# Patient Record
Sex: Female | Born: 1990 | Hispanic: No | Marital: Married | Smoking: Never smoker
Health system: Southern US, Community
[De-identification: ages and names within clinical notes are randomized; demographics above are authoritative.]

## PROBLEM LIST (undated history)

## (undated) ENCOUNTER — Ambulatory Visit (HOSPITAL_COMMUNITY): Discharge status: Against Medical Advice | Payer: BLUE CROSS/BLUE SHIELD

## (undated) DIAGNOSIS — F419 Anxiety disorder, unspecified: Secondary | ICD-10-CM

---

## 2013-05-28 ENCOUNTER — Inpatient Hospital Stay (HOSPITAL_COMMUNITY): Admission: AD | Admit: 2013-05-28 | Payer: Self-pay | Source: Ambulatory Visit | Admitting: Obstetrics and Gynecology

## 2014-12-02 DIAGNOSIS — Z803 Family history of malignant neoplasm of breast: Secondary | ICD-10-CM | POA: Insufficient documentation

## 2014-12-02 DIAGNOSIS — Z8249 Family history of ischemic heart disease and other diseases of the circulatory system: Secondary | ICD-10-CM | POA: Insufficient documentation

## 2015-11-04 DIAGNOSIS — O34219 Maternal care for unspecified type scar from previous cesarean delivery: Secondary | ICD-10-CM | POA: Insufficient documentation

## 2017-08-25 DIAGNOSIS — M5415 Radiculopathy, thoracolumbar region: Secondary | ICD-10-CM | POA: Diagnosis not present

## 2017-08-25 DIAGNOSIS — M9903 Segmental and somatic dysfunction of lumbar region: Secondary | ICD-10-CM | POA: Diagnosis not present

## 2017-08-25 DIAGNOSIS — M9902 Segmental and somatic dysfunction of thoracic region: Secondary | ICD-10-CM | POA: Diagnosis not present

## 2017-08-25 DIAGNOSIS — M5417 Radiculopathy, lumbosacral region: Secondary | ICD-10-CM | POA: Diagnosis not present

## 2017-10-18 DIAGNOSIS — Z01419 Encounter for gynecological examination (general) (routine) without abnormal findings: Secondary | ICD-10-CM | POA: Diagnosis not present

## 2017-10-18 DIAGNOSIS — Z6831 Body mass index (BMI) 31.0-31.9, adult: Secondary | ICD-10-CM | POA: Diagnosis not present

## 2017-10-18 DIAGNOSIS — Z0000 Encounter for general adult medical examination without abnormal findings: Secondary | ICD-10-CM | POA: Diagnosis not present

## 2017-11-10 DIAGNOSIS — M5415 Radiculopathy, thoracolumbar region: Secondary | ICD-10-CM | POA: Diagnosis not present

## 2017-11-10 DIAGNOSIS — M9903 Segmental and somatic dysfunction of lumbar region: Secondary | ICD-10-CM | POA: Diagnosis not present

## 2017-11-10 DIAGNOSIS — M9902 Segmental and somatic dysfunction of thoracic region: Secondary | ICD-10-CM | POA: Diagnosis not present

## 2017-11-10 DIAGNOSIS — M5417 Radiculopathy, lumbosacral region: Secondary | ICD-10-CM | POA: Diagnosis not present

## 2017-11-17 DIAGNOSIS — M9903 Segmental and somatic dysfunction of lumbar region: Secondary | ICD-10-CM | POA: Diagnosis not present

## 2017-11-17 DIAGNOSIS — M5415 Radiculopathy, thoracolumbar region: Secondary | ICD-10-CM | POA: Diagnosis not present

## 2017-11-17 DIAGNOSIS — M5417 Radiculopathy, lumbosacral region: Secondary | ICD-10-CM | POA: Diagnosis not present

## 2017-11-17 DIAGNOSIS — M9902 Segmental and somatic dysfunction of thoracic region: Secondary | ICD-10-CM | POA: Diagnosis not present

## 2017-12-02 DIAGNOSIS — M9903 Segmental and somatic dysfunction of lumbar region: Secondary | ICD-10-CM | POA: Diagnosis not present

## 2017-12-02 DIAGNOSIS — M5417 Radiculopathy, lumbosacral region: Secondary | ICD-10-CM | POA: Diagnosis not present

## 2017-12-02 DIAGNOSIS — M9902 Segmental and somatic dysfunction of thoracic region: Secondary | ICD-10-CM | POA: Diagnosis not present

## 2017-12-02 DIAGNOSIS — M5415 Radiculopathy, thoracolumbar region: Secondary | ICD-10-CM | POA: Diagnosis not present

## 2017-12-16 DIAGNOSIS — M5415 Radiculopathy, thoracolumbar region: Secondary | ICD-10-CM | POA: Diagnosis not present

## 2017-12-16 DIAGNOSIS — M5417 Radiculopathy, lumbosacral region: Secondary | ICD-10-CM | POA: Diagnosis not present

## 2017-12-16 DIAGNOSIS — M9902 Segmental and somatic dysfunction of thoracic region: Secondary | ICD-10-CM | POA: Diagnosis not present

## 2017-12-16 DIAGNOSIS — M9903 Segmental and somatic dysfunction of lumbar region: Secondary | ICD-10-CM | POA: Diagnosis not present

## 2017-12-20 DIAGNOSIS — Z3202 Encounter for pregnancy test, result negative: Secondary | ICD-10-CM | POA: Diagnosis not present

## 2017-12-20 DIAGNOSIS — Z30430 Encounter for insertion of intrauterine contraceptive device: Secondary | ICD-10-CM | POA: Diagnosis not present

## 2018-01-02 DIAGNOSIS — M9902 Segmental and somatic dysfunction of thoracic region: Secondary | ICD-10-CM | POA: Diagnosis not present

## 2018-01-02 DIAGNOSIS — M5415 Radiculopathy, thoracolumbar region: Secondary | ICD-10-CM | POA: Diagnosis not present

## 2018-01-02 DIAGNOSIS — M9903 Segmental and somatic dysfunction of lumbar region: Secondary | ICD-10-CM | POA: Diagnosis not present

## 2018-01-02 DIAGNOSIS — M5417 Radiculopathy, lumbosacral region: Secondary | ICD-10-CM | POA: Diagnosis not present

## 2018-01-09 DIAGNOSIS — Z30432 Encounter for removal of intrauterine contraceptive device: Secondary | ICD-10-CM | POA: Diagnosis not present

## 2018-12-11 DIAGNOSIS — B86 Scabies: Secondary | ICD-10-CM | POA: Diagnosis not present

## 2018-12-15 ENCOUNTER — Emergency Department (INDEPENDENT_AMBULATORY_CARE_PROVIDER_SITE_OTHER)
Admission: EM | Admit: 2018-12-15 | Discharge: 2018-12-15 | Disposition: A | Discharge status: Home / Self Care | Payer: BLUE CROSS/BLUE SHIELD | Source: Home / Self Care | Attending: Family Medicine | Admitting: Family Medicine

## 2018-12-15 DIAGNOSIS — R21 Rash and other nonspecific skin eruption: Secondary | ICD-10-CM

## 2018-12-15 MED ORDER — PREDNISONE 20 MG PO TABS: ORAL_TABLET | ORAL | 0 refills | Status: DC

## 2018-12-15 MED ORDER — ITRACONAZOLE 200 MG PO TABS: ORAL_TABLET | ORAL | 0 refills | Status: DC

## 2018-12-15 NOTE — ED Triage Notes (Signed)
Pt c/o rash that is itching on entire body. States it started on her leg about 2 weeks ago. She had teledoc visit and started permetherin cream with no improvement.

## 2018-12-15 NOTE — ED Provider Notes (Signed)
[facility]    CSN: [id number] Arrival date & time: 12/15/18  4696     History   Chief Complaint Chief Complaint  Patient presents with  . Rash    HPI Caitlyn Jackson is a 28 y.o. female.   Patient complains of onset of a pruritic rash on her left buttock about two weeks ago that has persisted and spread.  She has now developed pruritic lesions on her arms and lower legs.  She feels well otherwise.  She denies contact with poison ivy or other allergens.  She admits that during the past several weeks she has been working in her garden with much exposure (including sitting on the ground) to soil. About 5 days ago she had a Teledoc visit and was prescribed permethrin cream for possible scabies.  She has had no improvement.  The history is provided by the patient.  Rash  Location: buttocks, arms, and lower legs. Quality: dryness, itchiness and redness   Quality: not blistering, not bruising, not burning, not draining, not painful, not peeling, not scaling, not swelling and not weeping   Severity:  Moderate Onset quality:  Sudden Duration:  2 weeks Timing:  Constant Progression:  Worsening Chronicity:  New Context: not animal contact, not chemical exposure, not exposure to similar rash, not food, not hot tub use, not insect bite/sting, not medications, not new detergent/soap and not plant contact   Relieved by:  Nothing Worsened by:  Nothing Ineffective treatments: permethrin cream. Associated symptoms: no abdominal pain, no diarrhea, no fatigue, no fever, no headaches, no induration, no joint pain, no myalgias, no nausea and no sore throat     No past medical history on file.  There are no active problems to display for this patient.     OB History   No obstetric history on file.      Home Medications    Prior to Admission medications   Medication Sig Start Date End Date Taking? Authorizing Provider  Itraconazole 200 MG TABS Take one tab once daily  with food 12/15/18   Lattie Haw, MD  predniSONE (DELTASONE) 20 MG tablet Take one tab by mouth twice daily for 4 days, then one daily for 3 days. Take with food. 12/15/18   Lattie Haw, MD    Family History No family history on file.  Social History Social History   Tobacco Use  . Smoking status: Not on file  Substance Use Topics  . Alcohol use: Not on file  . Drug use: Not on file     Allergies   Ceftin [cefuroxime axetil]   Review of Systems Review of Systems  Constitutional: Negative for fatigue and fever.  HENT: Negative for sore throat.   Gastrointestinal: Negative for abdominal pain, diarrhea and nausea.  Musculoskeletal: Negative for arthralgias and myalgias.  Skin: Positive for rash.  Neurological: Negative for headaches.  All other systems reviewed and are negative.    Physical Exam Triage Vital Signs ED Triage Vitals  Enc Vitals Group     BP 12/15/18 0952 137/82     Pulse Rate 12/15/18 0952 92     Resp --      Temp 12/15/18 0952 97.8 F (36.6 C)     Temp Source 12/15/18 0952 Oral     SpO2 12/15/18 0952 99 %     Weight 12/15/18 0953 192 lb (87.1 kg)     Height --      Head Circumference --  Peak Flow --      Pain Score 12/15/18 0953 0     Pain Loc --      Pain Edu? --      Excl. in GC? --    No data found.  Updated Vital Signs BP 137/82 (BP Location: Right Arm)   Pulse 92   Temp 97.8 F (36.6 C) (Oral)   Wt 87.1 kg   SpO2 99%   Visual Acuity Right Eye Distance:   Left Eye Distance:   Bilateral Distance:    Right Eye Near:   Left Eye Near:    Bilateral Near:     Physical Exam Vitals signs and nursing note reviewed.  Constitutional:      General: She is not in acute distress. HENT:     Head: Normocephalic.     Right Ear: External ear normal.     Left Ear: External ear normal.     Nose: Nose normal.     Mouth/Throat:     Pharynx: Oropharynx is clear.  Eyes:     Conjunctiva/sclera: Conjunctivae normal.     Pupils:  Pupils are equal, round, and reactive to light.  Cardiovascular:     Heart sounds: Normal heart sounds.  Pulmonary:     Breath sounds: Normal breath sounds.  Abdominal:     Tenderness: There is no abdominal tenderness.  Musculoskeletal:     Right lower leg: No edema.     Left lower leg: No edema.  Lymphadenopathy:     Cervical: No cervical adenopathy.  Skin:    General: Skin is warm and dry.     Findings: Rash present.          Comments: There are macular erythematous lesions on arms and lower extremities.  On the buttocks there is confluent erythema does not fluoresce under Wood's lamp.  On the right volar arm are two adjacent nontender nodular lesions about 5mm diameter each with surrounding erythema.  Neurological:     Mental Status: She is alert and oriented to person, place, and time.      UC Treatments / Results  Labs (all labs ordered are listed, but only abnormal results are displayed) Labs Reviewed - No data to display  EKG None  Radiology No results found.  Procedures Procedures (including critical care time)  Medications Ordered in UC Medications - No data to display  Initial Impression / Assessment and Plan / UC Course  I have reviewed the triage vital signs and the nursing notes.  Pertinent labs & imaging results that were available during my care of the patient were reviewed by me and considered in my medical decision making (see chart for details).    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 Will begin itraconazole 200mg  daily for two weeks, and prednisone burst/taper. Recommend follow-up with dermatologist as soon as possible for biopsy of skin lesions (or follow-up with PCP within two weeks). Advised to avoid further soil exposure.   Final Clinical Impressions(s) / UC Diagnoses   Final diagnoses:  Rash and nonspecific skin eruption      Discharge Instructions     May take Benadryl at bedtime, if needed, for itching.   ED Prescriptions    Medication Sig Dispense Auth. Provider   predniSONE (DELTASONE) 20 MG tablet Take one tab by mouth twice daily for 4 days, then one daily for 3 days. Take with food. 11 tablet  Lattie Haw, MD   Itraconazole 200 MG TABS Take one tab once daily with food 14 tablet Lattie Haw, MD       Lattie Haw, MD 12/24/18 2229

## 2018-12-15 NOTE — Discharge Instructions (Addendum)
May take Benadryl at bedtime, if needed, for itching.

## 2018-12-19 ENCOUNTER — Telehealth: Payer: Self-pay

## 2018-12-19 NOTE — Telephone Encounter (Signed)
Pt called and asked for more prednisone.  Spoke with Dario Guardian, PA-C, and she instructed for patient to continue medication as she is not completed the course and pt can contact UC when Dr. Cathren Harsh is here since he is familiar with the patient.

## 2018-12-20 NOTE — Telephone Encounter (Signed)
Pt called back, spoke with Dr Cathren Harsh,.  Dr Cathren Harsh said pt does not need more prednisone, can take Zyrtec 10 mg 1 qd, but really needs to see a dermatologist or PCP to possibly have a biopsy on one of the lesions.  Pt notified.  Pt verbalized understanding.

## 2018-12-26 DIAGNOSIS — L309 Dermatitis, unspecified: Secondary | ICD-10-CM | POA: Diagnosis not present

## 2018-12-26 DIAGNOSIS — L089 Local infection of the skin and subcutaneous tissue, unspecified: Secondary | ICD-10-CM | POA: Diagnosis not present

## 2018-12-26 DIAGNOSIS — L299 Pruritus, unspecified: Secondary | ICD-10-CM | POA: Diagnosis not present

## 2019-01-25 DIAGNOSIS — L089 Local infection of the skin and subcutaneous tissue, unspecified: Secondary | ICD-10-CM | POA: Diagnosis not present

## 2019-05-11 DIAGNOSIS — M531 Cervicobrachial syndrome: Secondary | ICD-10-CM | POA: Diagnosis not present

## 2019-05-11 DIAGNOSIS — M9902 Segmental and somatic dysfunction of thoracic region: Secondary | ICD-10-CM | POA: Diagnosis not present

## 2019-05-11 DIAGNOSIS — M9901 Segmental and somatic dysfunction of cervical region: Secondary | ICD-10-CM | POA: Diagnosis not present

## 2019-05-11 DIAGNOSIS — M5414 Radiculopathy, thoracic region: Secondary | ICD-10-CM | POA: Diagnosis not present

## 2019-05-16 DIAGNOSIS — M5414 Radiculopathy, thoracic region: Secondary | ICD-10-CM | POA: Diagnosis not present

## 2019-05-16 DIAGNOSIS — M531 Cervicobrachial syndrome: Secondary | ICD-10-CM | POA: Diagnosis not present

## 2019-05-16 DIAGNOSIS — M9902 Segmental and somatic dysfunction of thoracic region: Secondary | ICD-10-CM | POA: Diagnosis not present

## 2019-05-16 DIAGNOSIS — M9901 Segmental and somatic dysfunction of cervical region: Secondary | ICD-10-CM | POA: Diagnosis not present

## 2019-05-18 DIAGNOSIS — M531 Cervicobrachial syndrome: Secondary | ICD-10-CM | POA: Diagnosis not present

## 2019-05-18 DIAGNOSIS — M9902 Segmental and somatic dysfunction of thoracic region: Secondary | ICD-10-CM | POA: Diagnosis not present

## 2019-05-18 DIAGNOSIS — M9901 Segmental and somatic dysfunction of cervical region: Secondary | ICD-10-CM | POA: Diagnosis not present

## 2019-05-18 DIAGNOSIS — M5414 Radiculopathy, thoracic region: Secondary | ICD-10-CM | POA: Diagnosis not present

## 2019-05-22 DIAGNOSIS — M5414 Radiculopathy, thoracic region: Secondary | ICD-10-CM | POA: Diagnosis not present

## 2019-05-22 DIAGNOSIS — M531 Cervicobrachial syndrome: Secondary | ICD-10-CM | POA: Diagnosis not present

## 2019-05-22 DIAGNOSIS — M9901 Segmental and somatic dysfunction of cervical region: Secondary | ICD-10-CM | POA: Diagnosis not present

## 2019-05-22 DIAGNOSIS — M9902 Segmental and somatic dysfunction of thoracic region: Secondary | ICD-10-CM | POA: Diagnosis not present

## 2019-05-25 DIAGNOSIS — M9901 Segmental and somatic dysfunction of cervical region: Secondary | ICD-10-CM | POA: Diagnosis not present

## 2019-05-25 DIAGNOSIS — M5414 Radiculopathy, thoracic region: Secondary | ICD-10-CM | POA: Diagnosis not present

## 2019-05-25 DIAGNOSIS — M9902 Segmental and somatic dysfunction of thoracic region: Secondary | ICD-10-CM | POA: Diagnosis not present

## 2019-05-25 DIAGNOSIS — M531 Cervicobrachial syndrome: Secondary | ICD-10-CM | POA: Diagnosis not present

## 2019-05-29 DIAGNOSIS — M531 Cervicobrachial syndrome: Secondary | ICD-10-CM | POA: Diagnosis not present

## 2019-05-29 DIAGNOSIS — M9902 Segmental and somatic dysfunction of thoracic region: Secondary | ICD-10-CM | POA: Diagnosis not present

## 2019-05-29 DIAGNOSIS — M5414 Radiculopathy, thoracic region: Secondary | ICD-10-CM | POA: Diagnosis not present

## 2019-05-29 DIAGNOSIS — M9901 Segmental and somatic dysfunction of cervical region: Secondary | ICD-10-CM | POA: Diagnosis not present

## 2019-06-01 DIAGNOSIS — M9901 Segmental and somatic dysfunction of cervical region: Secondary | ICD-10-CM | POA: Diagnosis not present

## 2019-06-01 DIAGNOSIS — M9902 Segmental and somatic dysfunction of thoracic region: Secondary | ICD-10-CM | POA: Diagnosis not present

## 2019-06-01 DIAGNOSIS — M5414 Radiculopathy, thoracic region: Secondary | ICD-10-CM | POA: Diagnosis not present

## 2019-06-01 DIAGNOSIS — M531 Cervicobrachial syndrome: Secondary | ICD-10-CM | POA: Diagnosis not present

## 2019-06-05 DIAGNOSIS — M9901 Segmental and somatic dysfunction of cervical region: Secondary | ICD-10-CM | POA: Diagnosis not present

## 2019-06-05 DIAGNOSIS — M9902 Segmental and somatic dysfunction of thoracic region: Secondary | ICD-10-CM | POA: Diagnosis not present

## 2019-06-05 DIAGNOSIS — M531 Cervicobrachial syndrome: Secondary | ICD-10-CM | POA: Diagnosis not present

## 2019-06-05 DIAGNOSIS — M5414 Radiculopathy, thoracic region: Secondary | ICD-10-CM | POA: Diagnosis not present

## 2019-06-13 DIAGNOSIS — M9901 Segmental and somatic dysfunction of cervical region: Secondary | ICD-10-CM | POA: Diagnosis not present

## 2019-06-13 DIAGNOSIS — M531 Cervicobrachial syndrome: Secondary | ICD-10-CM | POA: Diagnosis not present

## 2019-06-13 DIAGNOSIS — M9902 Segmental and somatic dysfunction of thoracic region: Secondary | ICD-10-CM | POA: Diagnosis not present

## 2019-06-13 DIAGNOSIS — M5414 Radiculopathy, thoracic region: Secondary | ICD-10-CM | POA: Diagnosis not present

## 2019-06-22 DIAGNOSIS — M9901 Segmental and somatic dysfunction of cervical region: Secondary | ICD-10-CM | POA: Diagnosis not present

## 2019-06-22 DIAGNOSIS — M9902 Segmental and somatic dysfunction of thoracic region: Secondary | ICD-10-CM | POA: Diagnosis not present

## 2019-06-22 DIAGNOSIS — M531 Cervicobrachial syndrome: Secondary | ICD-10-CM | POA: Diagnosis not present

## 2019-06-22 DIAGNOSIS — M5414 Radiculopathy, thoracic region: Secondary | ICD-10-CM | POA: Diagnosis not present

## 2019-07-20 ENCOUNTER — Encounter: Payer: Self-pay | Admitting: Osteopathic Medicine

## 2019-07-20 ENCOUNTER — Other Ambulatory Visit: Payer: Self-pay

## 2019-07-20 ENCOUNTER — Ambulatory Visit (INDEPENDENT_AMBULATORY_CARE_PROVIDER_SITE_OTHER): Payer: BC Managed Care – PPO | Admitting: Osteopathic Medicine

## 2019-07-20 VITALS — BP 98/64 | HR 86 | Temp 98.4°F | Ht 63.0 in | Wt 193.0 lb

## 2019-07-20 DIAGNOSIS — Z0000 Encounter for general adult medical examination without abnormal findings: Secondary | ICD-10-CM

## 2019-07-20 DIAGNOSIS — R0781 Pleurodynia: Secondary | ICD-10-CM | POA: Diagnosis not present

## 2019-07-20 DIAGNOSIS — Z9189 Other specified personal risk factors, not elsewhere classified: Secondary | ICD-10-CM

## 2019-07-20 DIAGNOSIS — Z23 Encounter for immunization: Secondary | ICD-10-CM | POA: Diagnosis not present

## 2019-07-20 HISTORY — DX: Pleurodynia: R07.81

## 2019-07-20 NOTE — Progress Notes (Signed)
HPI: Caitlyn Jackson is a 28 y.o. female who  has a past medical history of Rib pain on left side (07/20/2019).  she presents to [facility] today, 07/20/19,  for chief complaint of: New to establish - needs general check-up    Patient here for annual physical / wellness exam.  See preventive care reviewed as below.  Recent labs reviewed in detail with the patient.   Additional concerns today include:   Flexeril as needed for aches/pains.   G2P2, s/p C-Section 2017 for repeat and 2014 for breech  She is a chiropractor for back pain, she has some significant tenderness on left rib cage, concerned about breast issue, requests breast exam today.     Past medical, surgical, social and family history reviewed:  Patient Active Problem List   Diagnosis Date Noted  . Relies on partner vasectomy for contraception 07/20/2019  . Rib pain on left side 07/20/2019  . History of cesarean section complicating pregnancy 62/95/2841  . Family history of breast cancer in female 12/02/2014  . Family history of hypertension in mother 12/02/2014    Past Surgical History:  Procedure Laterality Date  . CESAREAN SECTION     2014/2017    Social History   Tobacco Use  . Smoking status: Never Smoker  . Smokeless tobacco: Never Used  Substance Use Topics  . Alcohol use: Never    Frequency: Never    Family History  Problem Relation Age of Onset  . High blood pressure Mother      Current medication list and allergy/intolerance information reviewed:    Current Outpatient Medications  Medication Sig Dispense Refill  . cyclobenzaprine (FLEXERIL) 5 MG tablet Take 5-10 mg by mouth every 8 (eight) hours.    Marland Kitchen levonorgestrel (MIRENA, 52 MG,) 20 MCG/24HR IUD Mirena 20 mcg/24 hours (6 yrs) 52 mg intrauterine device  Take 1 device by intrauterine route.     No current facility-administered medications for this visit.     Allergies  Allergen Reactions  .  Cefoxitin Hives  . Cefuroxime Axetil Hives, Other (See Comments) and Rash    Hives that turn to open sores        Review of Systems:  Constitutional:  No  fever, no chills, No recent illness, No unintentional weight changes. No significant fatigue.   HEENT: No  headache, no vision change, no hearing change, No sore throat, No  sinus pressure  Cardiac: No  chest pain, No  pressure, No palpitations, No  Orthopnea  Respiratory:  No  shortness of breath. No  Cough  Gastrointestinal: No  abdominal pain, No  nausea, No  vomiting,  No  blood in stool, No  diarrhea, No  constipation   Musculoskeletal: No new myalgia/arthralgia  Skin: No  Rash, No other wounds/concerning lesions  Genitourinary: No  incontinence, No  abnormal genital bleeding, No abnormal genital discharge  Hem/Onc: No  easy bruising/bleeding, No  abnormal lymph node  Endocrine: No cold intolerance,  No heat intolerance. No polyuria/polydipsia/polyphagia   Neurologic: No  weakness, No  dizziness, No  slurred speech/focal weakness/facial droop  Psychiatric: No  concerns with depression, No  concerns with anxiety, No sleep problems, No mood problems  Exam:  BP 98/64 (BP Location: Left Arm, Patient Position: Sitting, Cuff Size: Large)   Pulse 86   Temp 98.4 F (36.9 C) (Oral)   Ht 5\' 3"  (1.6 m)   Wt 193 lb 0.6 oz (87.6 kg)   LMP 06/30/2019  BMI 34.20 kg/m   Constitutional: VS see above. General Appearance: alert, well-developed, well-nourished, NAD  Eyes: Normal lids and conjunctive, non-icteric sclera  Ears, Nose, Mouth, Throat: MMM, Normal external inspection ears/nares/mouth/lips/gums. TM normal bilaterally.   Neck: No masses, trachea midline. No thyroid enlargement. No tenderness/mass appreciated. No lymphadenopathy  Respiratory: Normal respiratory effort. no wheeze, no rhonchi, no rales  Cardiovascular: S1/S2 normal, no murmur, no rub/gallop auscultated. RRR. No lower extremity  edema.  Gastrointestinal: Nontender, no masses. No hepatomegaly, no splenomegaly. No hernia appreciated. Bowel sounds normal. Rectal exam deferred.   Musculoskeletal: Gait normal. No clubbing/cyanosis of digits. Tenderness to ribs/intercostals just lateral ot L breast, midaxillary,.   Neurological: Normal balance/coordination. No tremor.   Skin: warm, dry, intact. No rash/ulcer. No concerning nevi or subq nodules on limited exam.    Psychiatric: Normal judgment/insight. Normal mood and affect. Oriented x3.   BREAST: L -No rashes/skin changes, normal fibrous breast tissue, no masses or tenderness, normal nipple without discharge, normal axilla. See MSK     No results found for this or any previous visit (from the past 72 hour(s)).  No results found.   ASSESSMENT/PLAN: The primary encounter diagnosis was Annual physical exam. Diagnoses of Need for influenza vaccination, Relies on partner vasectomy for contraception, and Rib pain on left side were also pertinent to this visit.   Orders Placed This Encounter  Procedures  . Flu Vaccine QUAD 6+ mos PF IM (Fluarix Quad PF)  . CBC  . COMPLETE METABOLIC PANEL WITH GFR  . LIPID SCREENING  . Ambulatory referral to Physical Therapy       Patient Instructions  Rib pain: I think there is some irritation of the ribs or muscle between the ribs. Let's try physical therapy to see if they have suggestions about how to help this. If that isn't resolving the issue, I'd follow-up with Dr. Karie Schwalbe (sports medicine) in our office, he can help us decide if we might need ultrasound, Xrays, or other evaluation/treatment.   General Preventive Care  Most recent routine screening lipids/other labs: ordered today   Everyone should have blood pressure checked once per year.   Tobacco: don't!  Alcohol: responsible moderation is ok for most adults - if you have concerns about your alcohol intake, please talk to me!   Exercise: as tolerated to reduce risk  of cardiovascular disease and diabetes. Strength training will also prevent osteoporosis.   Mental health: if need for mental health care (medicines, counseling, other), or concerns about moods, please let me know!   Sexual health: if need for STD testing, or if concerns with libido/pain problems, please let me know! If you need to discuss your birth control options, please let me know!   Advanced Directive: Living Will and/or Healthcare Power of Attorney recommended for all adults, regardless of age or health.  Vaccines  Flu vaccine: recommended for almost everyone, every fall.   Shingles vaccine: Shingrix recommended after age 30.  Pneumonia vaccines: Prevnar and Pneumovax recommended after age 30.  Tetanus booster: Tdap recommended every 10 years/with pregnancy. Due 2026.   HPV vaccine: Gardasil recommended up to age 30 to prevent HPV-associated diseases, including certain cancers.  Cancer screenings   Colon cancer screening: recommended for everyone at age 30, but some folks need a colonoscopy sooner if risk factors   Breast cancer screening: mammogram recommended at age 30-75, other screening tests or earlier screening based on risk factors/family history. I'd have your mom contact her local health departments, we can often get  free mammograms that way!  Cervical cancer screening: Pap every 1 to 5 years depending on age and other risk factors. Can usually stop at age 75 or w/ hysterectomy.   Lung cancer screening: not needed for non-smokers  Infection screenings . HIV: recommended screening at least once age 76-65, more often as needed. . Gonorrhea/Chlamydia: screening as needed . Hepatitis C: recommended for anyone born 62-1967 . TB: certain at-risk populations, or depending on work requirements and/or travel history Other . Bone Density Test: recommended for women at age 9        Visit summary with medication list and pertinent instructions was printed for patient  to review. All questions at time of visit were answered - patient instructed to contact office with any additional concerns or updates. ER/RTC precautions were reviewed with the patient.     Please note: voice recognition software was used to produce this document, and typos may escape review. Please contact Dr. Lyn Hollingshead for any needed clarifications.     Follow-up plan: Return in about 1 year (around 07/19/2020) for ANNUAL / PAP (call week prior to visit for lab orders).

## 2019-07-20 NOTE — Patient Instructions (Addendum)
Rib pain: I think there is some irritation of the ribs or muscle between the ribs. Let's try physical therapy to see if they have suggestions about how to help this. If that isn't resolving the issue, I'd follow-up with Dr. Darene Lamer (sports medicine) in our office, he can help [place] decide if we might need ultrasound, Xrays, or other evaluation/treatment.   General Preventive Care  Most recent routine screening lipids/other labs: ordered today   Everyone should have blood pressure checked once per year.   Tobacco: don't!  Alcohol: responsible moderation is ok for most adults - if you have concerns about your alcohol intake, please talk to me!   Exercise: as tolerated to reduce risk of cardiovascular disease and diabetes. Strength training will also prevent osteoporosis.   Mental health: if need for mental health care (medicines, counseling, other), or concerns about moods, please let me know!   Sexual health: if need for STD testing, or if concerns with libido/pain problems, please let me know! If you need to discuss your birth control options, please let me know!   Advanced Directive: Living Will and/or Healthcare Power of Attorney recommended for all adults, regardless of age or health.  Vaccines  Flu vaccine: recommended for almost everyone, every fall.   Shingles vaccine: Shingrix recommended after age 20.  Pneumonia vaccines: Prevnar and Pneumovax recommended after age 22.  Tetanus booster: Tdap recommended every 10 years/with pregnancy. Due 2026.   HPV vaccine: Gardasil recommended up to age 30 to prevent HPV-associated diseases, including certain cancers.  Cancer screenings   Colon cancer screening: recommended for everyone at age 29, but some folks need a colonoscopy sooner if risk factors   Breast cancer screening: mammogram recommended at age 45-75, other screening tests or earlier screening based on risk factors/family history. I'd have your mom contact her local health  departments, we can often get free mammograms that way!  Cervical cancer screening: Pap every 1 to 5 years depending on age and other risk factors. Can usually stop at age 42 or w/ hysterectomy.   Lung cancer screening: not needed for non-smokers  Infection screenings . HIV: recommended screening at least once age 25-65, more often as needed. . Gonorrhea/Chlamydia: screening as needed . Hepatitis C: recommended for anyone born 23-1967 . TB: certain at-risk populations, or depending on work requirements and/or travel history Other . Bone Density Test: recommended for women at age 29

## 2019-07-21 LAB — COMPLETE METABOLIC PANEL WITH GFR
AG Ratio: 1.6 (calc) (ref 1.0–2.5)
ALT: 37 U/L — ABNORMAL HIGH (ref 6–29)
AST: 21 U/L (ref 10–30)
Albumin: 4.6 g/dL (ref 3.6–5.1)
Alkaline phosphatase (APISO): 122 U/L (ref 31–125)
BUN: 10 mg/dL (ref 7–25)
CO2: 29 mmol/L (ref 20–32)
Calcium: 9.8 mg/dL (ref 8.6–10.2)
Chloride: 103 mmol/L (ref 98–110)
Creat: 0.66 mg/dL (ref 0.50–1.10)
GFR, Est African American: 138 mL/min/{1.73_m2} (ref >=60)
GFR, Est Non African American: 119 mL/min/{1.73_m2} (ref >=60)
Globulin: 2.8 g/dL (calc) (ref 1.9–3.7)
Glucose, Bld: 105 mg/dL — ABNORMAL HIGH (ref 65–99)
Potassium: 4.1 mmol/L (ref 3.5–5.3)
Sodium: 140 mmol/L (ref 135–146)
Total Bilirubin: 0.4 mg/dL (ref 0.2–1.2)
Total Protein: 7.4 g/dL (ref 6.1–8.1)

## 2019-07-21 LAB — CBC
HCT: 40.9 % (ref 35.0–45.0)
Hemoglobin: 13.7 g/dL (ref 11.7–15.5)
MCH: 29.9 pg (ref 27.0–33.0)
MCHC: 33.5 g/dL (ref 32.0–36.0)
MCV: 89.3 fL (ref 80.0–100.0)
MPV: 10.6 fL (ref 7.5–12.5)
Platelets: 322 10*3/uL (ref 140–400)
RBC: 4.58 10*6/uL (ref 3.80–5.10)
RDW: 12.8 % (ref 11.0–15.0)
WBC: 7.2 10*3/uL (ref 3.8–10.8)

## 2019-07-21 LAB — LIPID PANEL
Cholesterol: 153 mg/dL (ref <200)
HDL: 35 mg/dL — ABNORMAL LOW (ref >=50)
LDL Cholesterol (Calc): 92 mg/dL (calc)
Non-HDL Cholesterol (Calc): 118 mg/dL (calc) (ref <130)
Total CHOL/HDL Ratio: 4.4 (calc) (ref <5.0)
Triglycerides: 161 mg/dL — ABNORMAL HIGH (ref <150)

## 2019-07-30 DIAGNOSIS — M9901 Segmental and somatic dysfunction of cervical region: Secondary | ICD-10-CM | POA: Diagnosis not present

## 2019-07-30 DIAGNOSIS — M5414 Radiculopathy, thoracic region: Secondary | ICD-10-CM | POA: Diagnosis not present

## 2019-07-30 DIAGNOSIS — M9902 Segmental and somatic dysfunction of thoracic region: Secondary | ICD-10-CM | POA: Diagnosis not present

## 2019-07-30 DIAGNOSIS — M531 Cervicobrachial syndrome: Secondary | ICD-10-CM | POA: Diagnosis not present

## 2019-08-01 DIAGNOSIS — M9901 Segmental and somatic dysfunction of cervical region: Secondary | ICD-10-CM | POA: Diagnosis not present

## 2019-08-01 DIAGNOSIS — M9902 Segmental and somatic dysfunction of thoracic region: Secondary | ICD-10-CM | POA: Diagnosis not present

## 2019-08-01 DIAGNOSIS — M5414 Radiculopathy, thoracic region: Secondary | ICD-10-CM | POA: Diagnosis not present

## 2019-08-01 DIAGNOSIS — M531 Cervicobrachial syndrome: Secondary | ICD-10-CM | POA: Diagnosis not present

## 2019-08-15 DIAGNOSIS — M5414 Radiculopathy, thoracic region: Secondary | ICD-10-CM | POA: Diagnosis not present

## 2019-08-15 DIAGNOSIS — M9902 Segmental and somatic dysfunction of thoracic region: Secondary | ICD-10-CM | POA: Diagnosis not present

## 2019-08-15 DIAGNOSIS — M9901 Segmental and somatic dysfunction of cervical region: Secondary | ICD-10-CM | POA: Diagnosis not present

## 2019-08-15 DIAGNOSIS — M531 Cervicobrachial syndrome: Secondary | ICD-10-CM | POA: Diagnosis not present

## 2019-08-29 DIAGNOSIS — M531 Cervicobrachial syndrome: Secondary | ICD-10-CM | POA: Diagnosis not present

## 2019-08-29 DIAGNOSIS — M9902 Segmental and somatic dysfunction of thoracic region: Secondary | ICD-10-CM | POA: Diagnosis not present

## 2019-08-29 DIAGNOSIS — M9901 Segmental and somatic dysfunction of cervical region: Secondary | ICD-10-CM | POA: Diagnosis not present

## 2019-08-29 DIAGNOSIS — M5414 Radiculopathy, thoracic region: Secondary | ICD-10-CM | POA: Diagnosis not present

## 2019-09-07 DIAGNOSIS — R05 Cough: Secondary | ICD-10-CM | POA: Diagnosis not present

## 2019-09-07 DIAGNOSIS — R509 Fever, unspecified: Secondary | ICD-10-CM | POA: Diagnosis not present

## 2019-09-07 DIAGNOSIS — Z03818 Encounter for observation for suspected exposure to other biological agents ruled out: Secondary | ICD-10-CM | POA: Diagnosis not present

## 2019-09-12 DIAGNOSIS — M531 Cervicobrachial syndrome: Secondary | ICD-10-CM | POA: Diagnosis not present

## 2019-09-12 DIAGNOSIS — M9901 Segmental and somatic dysfunction of cervical region: Secondary | ICD-10-CM | POA: Diagnosis not present

## 2019-09-12 DIAGNOSIS — M9902 Segmental and somatic dysfunction of thoracic region: Secondary | ICD-10-CM | POA: Diagnosis not present

## 2019-09-12 DIAGNOSIS — M5414 Radiculopathy, thoracic region: Secondary | ICD-10-CM | POA: Diagnosis not present

## 2019-10-03 DIAGNOSIS — M5414 Radiculopathy, thoracic region: Secondary | ICD-10-CM | POA: Diagnosis not present

## 2019-10-03 DIAGNOSIS — M9901 Segmental and somatic dysfunction of cervical region: Secondary | ICD-10-CM | POA: Diagnosis not present

## 2019-10-03 DIAGNOSIS — M531 Cervicobrachial syndrome: Secondary | ICD-10-CM | POA: Diagnosis not present

## 2019-10-03 DIAGNOSIS — M9902 Segmental and somatic dysfunction of thoracic region: Secondary | ICD-10-CM | POA: Diagnosis not present

## 2019-10-31 DIAGNOSIS — M5414 Radiculopathy, thoracic region: Secondary | ICD-10-CM | POA: Diagnosis not present

## 2019-10-31 DIAGNOSIS — M9901 Segmental and somatic dysfunction of cervical region: Secondary | ICD-10-CM | POA: Diagnosis not present

## 2019-10-31 DIAGNOSIS — M9902 Segmental and somatic dysfunction of thoracic region: Secondary | ICD-10-CM | POA: Diagnosis not present

## 2019-10-31 DIAGNOSIS — M531 Cervicobrachial syndrome: Secondary | ICD-10-CM | POA: Diagnosis not present

## 2019-11-16 ENCOUNTER — Telehealth: Payer: Self-pay

## 2019-11-16 MED ORDER — CLONAZEPAM 0.5 MG PO TABS: 0.2500 mg | ORAL_TABLET | Freq: Three times a day (TID) | ORAL | 0 refills | Status: DC | PRN

## 2019-11-16 NOTE — Telephone Encounter (Signed)
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

## 2019-11-16 NOTE — Telephone Encounter (Signed)
Rx sent 

## 2019-11-19 NOTE — Telephone Encounter (Signed)
Pt has been updated. As per pt, she already has rx on hand. No other inquiries during call.

## 2019-11-26 DIAGNOSIS — M9902 Segmental and somatic dysfunction of thoracic region: Secondary | ICD-10-CM | POA: Diagnosis not present

## 2019-11-26 DIAGNOSIS — M531 Cervicobrachial syndrome: Secondary | ICD-10-CM | POA: Diagnosis not present

## 2019-11-26 DIAGNOSIS — M9901 Segmental and somatic dysfunction of cervical region: Secondary | ICD-10-CM | POA: Diagnosis not present

## 2019-11-26 DIAGNOSIS — M5414 Radiculopathy, thoracic region: Secondary | ICD-10-CM | POA: Diagnosis not present

## 2019-12-11 ENCOUNTER — Encounter: Payer: Self-pay | Admitting: Osteopathic Medicine

## 2019-12-11 ENCOUNTER — Ambulatory Visit (INDEPENDENT_AMBULATORY_CARE_PROVIDER_SITE_OTHER): Payer: BC Managed Care – PPO | Admitting: Osteopathic Medicine

## 2019-12-11 ENCOUNTER — Other Ambulatory Visit (HOSPITAL_COMMUNITY)
Admission: RE | Admit: 2019-12-11 | Discharge: 2019-12-11 | Disposition: A | Discharge status: Home / Self Care | Payer: BC Managed Care – PPO | Source: Ambulatory Visit | Attending: Osteopathic Medicine | Admitting: Osteopathic Medicine

## 2019-12-11 ENCOUNTER — Other Ambulatory Visit: Payer: Self-pay

## 2019-12-11 VITALS — BP 118/78 | HR 84 | Wt 195.0 lb

## 2019-12-11 DIAGNOSIS — Z01419 Encounter for gynecological examination (general) (routine) without abnormal findings: Secondary | ICD-10-CM | POA: Diagnosis not present

## 2019-12-11 DIAGNOSIS — Z124 Encounter for screening for malignant neoplasm of cervix: Secondary | ICD-10-CM

## 2019-12-11 DIAGNOSIS — R635 Abnormal weight gain: Secondary | ICD-10-CM

## 2019-12-11 NOTE — Patient Instructions (Signed)
Weight loss: important things to remember  It is hard work! You will have setbacks, but don't get discouraged. The goal is not short-term success, it is long-term health.   Looking at the numbers is important to track your progress and set goals, but how you are feeling and your overall health are the most important things! BMI and pounds and calories and miles logged aren't everything - they are tools to help [place] reach your goals.  Things to remember for exercise for weight loss:   Please note - I am not a certified personal trainer. I can present you with ideas and general workout goals, but an exercise program is largely up to you. Find something you can stick with, and something you enjoy!   As you progress in your exercise regimen think about gradually increasing the following, week by week:   intensity (how strenuous is your workout)  frequency (how often you are exercising)  duration (how many minutes at a time you are exercising)  Walking for 20 minutes a day is certainly better than nothing, but more strenuous exercise will develop better cardiovascular fitness.   interval training (high-intensity alternating with low-intensity, think walk/jog rather than just walk)  muscle strengthening exercises (weight lifting, calisthenics, yoga) - this also helps prevent osteoporosis!   Things to remember for diet changes for weight loss:   Please note - I am not a certified dietician. I can present you with ideas and general diet goals, but a meal plan is largely up to you. I am happy to refer you to a dietician who can give you a detailed meal plan.  Apps/logs can be helpful to track how you're eating! It's not realistic to be logging everything you eat forever, but when you're starting a healthy eating lifestyle it's very helpful, and checking in with logs now and then helps you stick to your program!   Calorie restriction with the goal weight loss of no more than one to one and a half  pounds per week.   Increase lean protein such as chicken, fish, [place].   Decrease fatty foods such as dairy, butter.   Decrease sugary foods. Avoid sugary drinks such as soda or juice.  Increase fiber found in fruit and vegetables.   Medications approved for long-term use for obesity  Qsymia (Phentermine and Topiramate)  Saxenda (Liraglutide daily injection)  Contrave (Bupropion and Naltrexone)  Orlistat (Xenical, Alli)  Bupropion (Wellbutrin) I recommend that you research the above medications and see which one(s) your insurance may or may not cover: If you call your insurance, ask them specifically what medications are on their formulary that are approved for obesity treatment. They should be able to send you a list or tell you over the phone. Remember, medications aren't magic! You MUST be diligent about lifestyle changes as well!

## 2019-12-11 NOTE — Progress Notes (Signed)
Caitlyn Jackson is a 29 y.o. female who presents to  [facility] at [facility]  today, 12/11/19, seeking care for the following: . Pap . Discuss weight loss     ASSESSMENT & PLAN with other pertinent history/findings:  The primary encounter diagnosis was Cervical cancer screening. A diagnosis of Unintended weight gain was also pertinent to this visit.  GYN: No lesions/ulcers to external genitalia, normal urethra, normal vaginal mucosa, physiologic discharge, cervix normal without lesions, uterus not enlarged or tender, adnexa no masses and nontender  Consider meds as below for weight loss, she states her sister had some sort of meal substitute prescribed, im not familiar with this Rx but would be open to learning more, pt will contact me w/ name of the Rx    Patient Instructions  Weight loss: important things to remember  It is hard work! You will have setbacks, but don't get discouraged. The goal is not short-term success, it is long-term health.   Looking at the numbers is important to track your progress and set goals, but how you are feeling and your overall health are the most important things! BMI and pounds and calories and miles logged aren't everything - they are tools to help [place] reach your goals.  Things to remember for exercise for weight loss:   Please note - I am not a certified personal trainer. I can present you with ideas and general workout goals, but an exercise program is largely up to you. Find something you can stick with, and something you enjoy!   As you progress in your exercise regimen think about gradually increasing the following, week by week:   intensity (how strenuous is your workout)  frequency (how often you are exercising)  duration (how many minutes at a time you are exercising)  Walking for 20 minutes a day is certainly better than nothing, but more strenuous exercise will develop better  cardiovascular fitness.   interval training (high-intensity alternating with low-intensity, think walk/jog rather than just walk)  muscle strengthening exercises (weight lifting, calisthenics, yoga) - this also helps prevent osteoporosis!   Things to remember for diet changes for weight loss:   Please note - I am not a certified dietician. I can present you with ideas and general diet goals, but a meal plan is largely up to you. I am happy to refer you to a dietician who can give you a detailed meal plan.  Apps/logs can be helpful to track how you're eating! It's not realistic to be logging everything you eat forever, but when you're starting a healthy eating lifestyle it's very helpful, and checking in with logs now and then helps you stick to your program!   Calorie restriction with the goal weight loss of no more than one to one and a half pounds per week.   Increase lean protein such as chicken, fish, [place].   Decrease fatty foods such as dairy, butter.   Decrease sugary foods. Avoid sugary drinks such as soda or juice.  Increase fiber found in fruit and vegetables.   Medications approved for long-term use for obesity  Qsymia (Phentermine and Topiramate)  Saxenda (Liraglutide daily injection)  Contrave (Bupropion and Naltrexone)  Orlistat (Xenical, Alli)  Bupropion (Wellbutrin) I recommend that you research the above medications and see which one(s) your insurance may or may not cover: If you call your insurance, ask them specifically what medications are on their formulary that are approved for obesity treatment.  They should be able to send you a list or tell you over the phone. Remember, medications aren't magic! You MUST be diligent about lifestyle changes as well!      No orders of the defined types were placed in this encounter.   No orders of the defined types were placed in this encounter.      Follow-up instructions: Return in about 1 year (around 12/10/2020)  for [place] (call week prior to visit for lab orders).                                         BP 118/78   Pulse 84   Wt 195 lb (88.5 kg)   LMP 11/28/2019   SpO2 99%   BMI 34.54 kg/m   No outpatient medications have been marked as taking for the 12/11/19 encounter (Office Visit) with Emeterio Reeve, DO.    No results found for this or any previous visit (from the past 72 hour(s)).  No results found.  Depression screen [facility] 2/9 12/11/2019 07/20/2019  Decreased Interest 0 1  Down, Depressed, Hopeless 0 1  PHQ - 2 Score 0 2  Altered sleeping - 1  Tired, decreased energy - 0  Change in appetite - 0  Feeling bad or failure about yourself  - 0  Trouble concentrating - 0  Moving slowly or fidgety/restless - 0  Suicidal thoughts - 0  PHQ-9 Score - 3  Difficult doing work/chores - Not difficult at all    GAD 7 : Generalized Anxiety Score 07/20/2019  Nervous, Anxious, on Edge 0  Control/stop worrying 0  Worry too much - different things 1  Trouble relaxing 0  Restless 0  Easily annoyed or irritable 0  Afraid - awful might happen 1  Total GAD 7 Score 2  Anxiety Difficulty Not difficult at all      All questions at time of visit were answered - patient instructed to contact office with any additional concerns or updates.  ER/RTC precautions were reviewed with the patient.  Please note: voice recognition software was used to produce this document, and typos may escape review. Please contact Dr. Sheppard Coil for any needed clarifications.   Total encounter time: 20 minutes.

## 2019-12-13 LAB — CYTOLOGY - PAP
Comment: NEGATIVE
Diagnosis: NEGATIVE
Diagnosis: REACTIVE
High risk HPV: NEGATIVE

## 2020-01-01 DIAGNOSIS — M9902 Segmental and somatic dysfunction of thoracic region: Secondary | ICD-10-CM | POA: Diagnosis not present

## 2020-01-01 DIAGNOSIS — M5414 Radiculopathy, thoracic region: Secondary | ICD-10-CM | POA: Diagnosis not present

## 2020-01-01 DIAGNOSIS — M9901 Segmental and somatic dysfunction of cervical region: Secondary | ICD-10-CM | POA: Diagnosis not present

## 2020-01-01 DIAGNOSIS — M531 Cervicobrachial syndrome: Secondary | ICD-10-CM | POA: Diagnosis not present

## 2020-01-29 DIAGNOSIS — N3000 Acute cystitis without hematuria: Secondary | ICD-10-CM | POA: Diagnosis not present

## 2020-02-14 DIAGNOSIS — M9902 Segmental and somatic dysfunction of thoracic region: Secondary | ICD-10-CM | POA: Diagnosis not present

## 2020-02-14 DIAGNOSIS — M5414 Radiculopathy, thoracic region: Secondary | ICD-10-CM | POA: Diagnosis not present

## 2020-02-14 DIAGNOSIS — M9901 Segmental and somatic dysfunction of cervical region: Secondary | ICD-10-CM | POA: Diagnosis not present

## 2020-02-14 DIAGNOSIS — M531 Cervicobrachial syndrome: Secondary | ICD-10-CM | POA: Diagnosis not present

## 2020-03-14 DIAGNOSIS — M9902 Segmental and somatic dysfunction of thoracic region: Secondary | ICD-10-CM | POA: Diagnosis not present

## 2020-03-14 DIAGNOSIS — M5414 Radiculopathy, thoracic region: Secondary | ICD-10-CM | POA: Diagnosis not present

## 2020-03-14 DIAGNOSIS — M531 Cervicobrachial syndrome: Secondary | ICD-10-CM | POA: Diagnosis not present

## 2020-03-14 DIAGNOSIS — M9901 Segmental and somatic dysfunction of cervical region: Secondary | ICD-10-CM | POA: Diagnosis not present

## 2020-03-28 ENCOUNTER — Telehealth: Payer: Self-pay

## 2020-03-28 NOTE — Telephone Encounter (Signed)
Patient called office stating she had a visit on 12/11/2019, where she initially  reported discomfort in her breast. She says she is experiencing more pain her breast that is getting worse and is requesting a referral to have a mammogram done. Please advise

## 2020-03-31 NOTE — Telephone Encounter (Signed)
Needs visit for reexamination to decide on next steps, may need mammo +/- ultrasound or other testing

## 2020-04-01 NOTE — Telephone Encounter (Signed)
The soonest I can get the PT in is 04/11/20. Would you like to wait until then or take an acute slot 04/04/20. (one left)

## 2020-04-01 NOTE — Telephone Encounter (Signed)
Left PT VM with information.

## 2020-04-01 NOTE — Telephone Encounter (Signed)
Please call and schedule pt

## 2020-04-04 ENCOUNTER — Ambulatory Visit: Payer: BC Managed Care – PPO | Admitting: Osteopathic Medicine

## 2020-04-04 ENCOUNTER — Encounter: Payer: Self-pay | Admitting: Medical-Surgical

## 2020-04-04 ENCOUNTER — Other Ambulatory Visit: Payer: Self-pay

## 2020-04-04 ENCOUNTER — Ambulatory Visit (INDEPENDENT_AMBULATORY_CARE_PROVIDER_SITE_OTHER): Payer: BC Managed Care – PPO | Admitting: Medical-Surgical

## 2020-04-04 VITALS — BP 121/82 | HR 80 | Temp 98.3°F | Ht 63.0 in | Wt 191.0 lb

## 2020-04-04 DIAGNOSIS — N644 Mastodynia: Secondary | ICD-10-CM

## 2020-04-04 NOTE — Progress Notes (Signed)
Subjective:    CC: Breast pain  HPI: Pleasant 29 year old female presenting today for reevaluation of breast pain.  She notes that she has had bilateral breast pain on the outer halves of her breasts since approximately mid June.  Pain occurs at least once daily lasting for several minutes and is described as a throbbing ache.  She reports wearing supportive bras but does note that she has gained some weight and some of her bras do not fit as well as they used to.  She does drink 1 cup of coffee daily.  Notes that in the increase in coffee or soda consumption worsens the pain and causes it to spread to the inner halves of her breasts.  Is not currently exercising.  Does not smoke.  Denies fevers, chills, skin changes, and nipple discharge.  Has been doing exams at home but has not noticed any lumps, nodules, swelling, or breast tissue changes.  I reviewed the past medical history, family history, social history, surgical history, and allergies today and no changes were needed.  Please see the problem list section below in epic for further details.  Past Medical History: Past Medical History:  Diagnosis Date  . Rib pain on left side 07/20/2019   Past Surgical History: Past Surgical History:  Procedure Laterality Date  . CESAREAN SECTION     2014/2017   Social History: Social History   Socioeconomic History  . Marital status: Married    Spouse name: Not on file  . Number of children: 2  . Years of education: Not on file  . Highest education level: Not on file  Occupational History  . Occupation: DIRECT MARKETING COORDINATOR    Employer: [employer]   Tobacco Use  . Smoking status: Never Smoker  . Smokeless tobacco: Never Used  Vaping Use  . Vaping Use: Never used  Substance and Sexual Activity  . Alcohol use: Never  . Drug use: Never  . Sexual activity: Never    Partners: Male  Other Topics Concern  . Not on file  Social History Narrative  . Not on file   Social  Determinants of Health   Financial Resource Strain:   . Difficulty of Paying Living Expenses:   Food Insecurity:   . Worried About Programme researcher, broadcasting/film/video in the Last Year:   . Barista in the Last Year:   Transportation Needs:   . Freight forwarder (Medical):   Marland Kitchen Lack of Transportation (Non-Medical):   Physical Activity:   . Days of Exercise per Week:   . Minutes of Exercise per Session:   Stress:   . Feeling of Stress :   Social Connections:   . Frequency of Communication with Friends and Family:   . Frequency of Social Gatherings with Friends and Family:   . Attends Religious Services:   . Active Member of Clubs or Organizations:   . Attends Banker Meetings:   Marland Kitchen Marital Status:    Family History: Family History  Problem Relation Age of Onset  . High blood pressure Mother    Allergies: Allergies  Allergen Reactions  . Cefoxitin Hives  . Cefuroxime Axetil Hives, Other (See Comments) and Rash    Hives that turn to open sores     Medications: See med rec.  Review of Systems: See HPI for pertinent positives and negatives.   Objective:    General: Well Developed, well nourished, and in no acute distress.  Neuro: Alert and oriented x3.  HEENT: Normocephalic, atraumatic.  Skin: Warm and dry. Cardiac: Regular rate and rhythm, no murmurs rubs or gallops, no lower extremity edema.  Respiratory: Clear to auscultation bilaterally. Not using accessory muscles, speaking in full sentences. Breasts: Mild areas of tenderness to palpation bilaterally but no lumps or nodules noted.    Impression and Recommendations:    1. Breast pain Referral to genetics for genetic testing per patient request. We will go ahead and do [place] of bilateral breasts with diagnostic mammogram if needed. Discussed avoidance of caffeine and making sure bras are well fitted and supportive. Advised patient to contact me if she doesn't hear from the genetics folks about scheduling an  appointment.  - Ambulatory referral to Genetics - US BREAST COMPLETE UNI RIGHT INC AXILLA; Future - US BREAST COMPLETE UNI LEFT INC AXILLA; Future  Return if symptoms worsen or fail to improve. ___________________________________________ Thayer Ohm, DNP, APRN, FNP-BC Primary Care and Sports Medicine [facility] [place]

## 2020-04-09 ENCOUNTER — Telehealth: Payer: Self-pay | Admitting: Genetic Counselor

## 2020-04-09 NOTE — Telephone Encounter (Signed)
Received a genetic counseling referral from Caitlyn Butter, NP for fhx of breast cancer. Caitlyn Jackson retunred my call and has been scheduled to see Caitlyn Jackson on 8/19 at 2pm. Pt aware to arrive 15 minutes early.

## 2020-04-24 ENCOUNTER — Other Ambulatory Visit: Payer: Self-pay

## 2020-04-24 ENCOUNTER — Inpatient Hospital Stay: Payer: BC Managed Care – PPO

## 2020-04-24 ENCOUNTER — Inpatient Hospital Stay: Payer: BC Managed Care – PPO | Attending: Medical-Surgical | Admitting: Genetic Counselor

## 2020-04-24 DIAGNOSIS — J069 Acute upper respiratory infection, unspecified: Secondary | ICD-10-CM | POA: Diagnosis not present

## 2020-04-24 DIAGNOSIS — Z803 Family history of malignant neoplasm of breast: Secondary | ICD-10-CM

## 2020-04-25 ENCOUNTER — Encounter: Payer: Self-pay | Admitting: Genetic Counselor

## 2020-04-25 DIAGNOSIS — N644 Mastodynia: Secondary | ICD-10-CM | POA: Diagnosis not present

## 2020-04-25 DIAGNOSIS — R928 Other abnormal and inconclusive findings on diagnostic imaging of breast: Secondary | ICD-10-CM | POA: Diagnosis not present

## 2020-04-25 DIAGNOSIS — Z803 Family history of malignant neoplasm of breast: Secondary | ICD-10-CM | POA: Diagnosis not present

## 2020-04-25 NOTE — Progress Notes (Signed)
REFERRING PROVIDER: Samuel Bouche, NP [address] [place],  [place] [address]  PRIMARY PROVIDER:  Emeterio Reeve, DO  PRIMARY REASON FOR VISIT:  1. Family history of breast cancer in female    HISTORY OF PRESENT ILLNESS:   Caitlyn Jackson, a 29 y.o. female, was seen for a [facility] cancer genetics consultation at the request of Dr. Charna Jackson due to a family history of breast cancer in a maternal aunt.  Caitlyn Jackson presents to clinic today to discuss the possibility of a hereditary predisposition to cancer, to discuss genetic testing, and to further clarify her future cancer risks, as well as potential cancer risks for family members.    Caitlyn Jackson is a 29 y.o. female with no personal history of cancer.  She was seen by Caitlyn Bouche, NP in July due to bilateral breast pain and reports having a breast ultrasound scheduled for August 2021.   RISK FACTORS:  Menarche was at age 38.  First live birth at age 58.  OCP use for approximately 2 weeks. Ovaries intact: yes.  Hysterectomy: no.  Menopausal status: premenopausal.  HRT use: 0 years. Colonoscopy: no; not examined.  Mammogram within the last year: no. Number of breast biopsies: 0. Up to date with pelvic exams: yes; normal PAP in 12/2019 Any excessive radiation exposure in the past: no  Past Medical History:  Diagnosis Date   Rib pain on left side 07/20/2019    Past Surgical History:  Procedure Laterality Date   CESAREAN SECTION     2014/2017    Social History   Socioeconomic History   Marital status: Married    Spouse name: Not on file   Number of children: 2   Years of education: Not on file   Highest education level: Not on file  Occupational History   Occupation: DIRECT MARKETING COORDINATOR    Employer: [employer]   Tobacco Use   Smoking status: Never Smoker   Smokeless tobacco: Never Used  Vaping Use   Vaping Use: Never used  Substance and Sexual Activity   Alcohol use: Never   Drug  use: Never   Sexual activity: Never    Partners: Male  Other Topics Concern   Not on file  Social History Narrative   Not on file   Social Determinants of Health   Financial Resource Strain:    Difficulty of Paying Living Expenses: Not on file  Food Insecurity:    Worried About Charity fundraiser in the Last Year: Not on file   [place] in the Last Year: Not on file  Transportation Needs:    Lack of Transportation (Medical): Not on file   Lack of Transportation (Non-Medical): Not on file  Physical Activity:    Days of Exercise per Week: Not on file   Minutes of Exercise per Session: Not on file  Stress:    Feeling of Stress : Not on file  Social Connections:    Frequency of Communication with Friends and Family: Not on file   Frequency of Social Gatherings with Friends and Family: Not on file   Attends Religious Services: Not on file   Active Member of Clubs or Organizations: Not on file   Attends Archivist Meetings: Not on file   Marital Status: Not on file     FAMILY HISTORY:  We obtained a detailed, 4-generation family history.  Significant diagnoses are listed below: Family History  Problem Relation Age of Onset  High blood pressure Mother    Breast cancer Maternal Aunt        dx late 39s   Liver cancer Cousin        maternal cousin; dx 24s           Caitlyn Jackson has one son and one daughter, ages 76 and 45.  She does not have any siblings.  Her mother is 98 years old, and her father is 35 years old.  Caitlyn Jackson has one maternal aunt, now age 45, who was diagnosed with breast cancer in her late 4s.  Caitlyn Jackson has one maternal cousin who was diagnosed with liver cancer in his 42s.  She has limited information about her maternal family.  She does not report any other maternal or paternal family history of cancer.   Caitlyn Jackson is unaware of previous family history of genetic testing for hereditary cancer risks. Patient's maternal  ancestors are of [place] descent, and paternal ancestors are of German/[place] descent. There is no reported Ashkenazi Jewish ancestry. There is no known consanguinity.  GENETIC COUNSELING ASSESSMENT: Caitlyn Jackson is a 29 y.o. female with a family history of breast cancer which is unlikely to be associated with a hereditary cancer syndrome given the number of people diagnosed in her family with cancer. We, therefore, discussed and recommended the following at today's visit.   DISCUSSION: We discussed that most cancer is sporadic.  We also discussed that 5 - 10% of cancer is hereditary, with most cases of hereditary breast cancer associated with mutations in BRCA1 and BRCA2.  There are other genes that can be associated with hereditary breast cancer syndromes.  These include but are not limited to PALB2, CHEK2, and ATM.  We discussed that testing is beneficial for those with several family members with the same or related cancers for several reasons, including knowing about other cancer risks, identifying potential screening and risk-reduction options that may be appropriate, and to understand if other family members could be at risk for cancer and allow them to undergo genetic testing.  We discussed with Caitlyn Jackson that the family history breast cancer does not meet insurance or NCCN criteria for genetic testing and, therefore, is not highly consistent with a familial hereditary cancer syndrome.  We feel she is at low risk to harbor a gene mutation associated with such a condition. Thus, we did not recommend any genetic testing, at this time, and recommended Caitlyn Jackson continue to follow the cancer screening guidelines given by her primary healthcare provider   Despite our recommendation and discussion of the limitations of genetic testing in unaffected individuals, Caitlyn Jackson was interested in proceeding with genetic testing using the self-pay option. We reviewed the characteristics, features and inheritance  patterns of hereditary cancer syndromes. We also discussed genetic testing, including the appropriate family members to test, the process of testing, and turn-around-time for results. We discussed the implications of a negative, positive, carrier and/or variant of uncertain significant result. We discussed that negative results would be uninformative given that Caitlyn Jackson does not have a personal history of cancer.   Caitlyn Jackson was offered a common hereditary cancer panel (48 genes) and an expanded pan-cancer panel (85 genes). Caitlyn Jackson was informed of the benefits and limitations of each panel, including that expanded pan-cancer panels contain several preliminary evidence genes that do not have clear management guidelines at this point in time.  We also discussed that as the number of genes included on a panel increases, the  chances of variants of uncertain significance increases.  After considering the benefits and limitations of each gene panel, Caitlyn Jackson elected to have an expanded pan-cancer panel through [facility].  The Multi-Gene Panel offered by [facility] includes sequencing and/or deletion duplication testing of the following 85 genes: AIP, ALK, APC, ATM, AXIN2,BAP1,  BARD1, BLM, BMPR1A, BRCA1, BRCA2, BRIP1, CASR, CDC73, CDH1, CDK4, CDKN1B, CDKN1C, CDKN2A (p14ARF), CDKN2A (p16INK4a), CEBPA, CHEK2, CTNNA1, DICER1, DIS3L2, EGFR (c.2369C>T, p.Thr790Met variant only), EPCAM (Deletion/duplication testing only), FH, FLCN, GATA2, GPC3, GREM1 (Promoter region deletion/duplication testing only), HOXB13 (c.251G>A, p.Gly84Glu), HRAS, KIT, MAX, MEN1, MET, MITF (c.952G>A, p.Glu318Lys variant only), MLH1, MSH2, MSH3, MSH6, MUTYH, NBN, NF1, NF2, NTHL1, PALB2, PDGFRA, PHOX2B, PMS2, POLD1, POLE, POT1, PRKAR1A, PTCH1, PTEN, RAD50, RAD51C, RAD51D, RB1, RECQL4, RET, RNF43, RUNX1, SDHAF2, SDHA (sequence changes only), SDHB, SDHC, SDHD, SMAD4, SMARCA4, SMARCB1, SMARCE1, STK11, SUFU, TERC, TERT, TMEM127, TP53, TSC1, TSC2, VHL, WRN  and WT1.    We discussed that some people do not want to undergo genetic testing due to fear of genetic discrimination.  A federal law called the Genetic Information Non-Discrimination Act (GINA) of 2008 helps protect individuals against genetic discrimination based on their genetic test results.  It impacts both health insurance and employment.  With health insurance, it protects against increased premiums, being kicked off insurance or being forced to take a test in order to be insured.  For employment it protects against hiring, firing and promoting decisions based on genetic test results.  GINA does not apply to those in the [employer], those who work for companies with less than 15 employees, and new life insurance or long-term disability insurance policies.  Health status due to a cancer diagnosis is not protected under GINA.  PLAN: After considering the risks, benefits, and limitations, Caitlyn Jackson provided informed consent to pursue genetic testing and the blood sample was sent to [facility] for analysis of the Multi-Cancer Panel. Results should be available within approximately 3 weeks' time, at which point they will be disclosed by telephone to Caitlyn Jackson, as will any additional recommendations warranted by these results. Caitlyn Jackson will receive a summary of her genetic counseling visit and a copy of her results once available. This information will also be available in Epic.   Lastly, we encouraged Caitlyn Jackson to remain in contact with cancer genetics annually so that we can continuously update the family history and inform her of any changes in cancer genetics and testing that may be of benefit for this family.   Caitlyn Jackson questions were answered to her satisfaction today. Our contact information was provided should additional questions or concerns arise. Thank you for the referral and allowing [place] to share in the care of your patient.   Melquan Ernsberger M. Joette Catching, [email or web address] (P) [phone]   The patient was seen for a total of 60 minutes in face-to-face genetic counseling.  This patient was discussed with Drs. Magrinat, Lindi Adie and/or Burr Medico who agrees with the above.    _______________________________________________________________________ For Office Staff:  Number of people involved in session: 1 Was an Intern/ student involved with case: no

## 2020-05-05 ENCOUNTER — Telehealth: Payer: Self-pay | Admitting: Genetic Counselor

## 2020-05-05 NOTE — Telephone Encounter (Signed)
Called patient in attempt to disclose results of genetic testing.  Was unable to reach patient or leave voicemail.

## 2020-05-13 NOTE — Telephone Encounter (Signed)
Contacted patient in attempt to disclose results of genetic testing.  LVM with contact information requesting a call back.  

## 2020-05-14 NOTE — Telephone Encounter (Signed)
Contacted patient in attempt to disclose results of genetic testing.  LVM with contact information requesting a call back.  

## 2020-05-19 NOTE — Telephone Encounter (Signed)
Contacted patient in attempt to disclose results of genetic testing.  LVM with contact information requesting a call back.  

## 2020-05-19 NOTE — Telephone Encounter (Signed)
Revealed negative genetic testing.  Discussed that we do not know why there is cancer in the family. It could be that the cancer is sporadic, due to a different gene that we are not testing, or that Caitlyn Jackson did not inherit a familial variant.  Revealed VUS in RAD50.

## 2020-05-20 ENCOUNTER — Encounter: Payer: Self-pay | Admitting: Genetic Counselor

## 2020-05-20 ENCOUNTER — Ambulatory Visit: Payer: Self-pay | Admitting: Genetic Counselor

## 2020-05-20 DIAGNOSIS — Z1379 Encounter for other screening for genetic and chromosomal anomalies: Secondary | ICD-10-CM

## 2020-05-20 DIAGNOSIS — Z803 Family history of malignant neoplasm of breast: Secondary | ICD-10-CM

## 2020-05-20 NOTE — Progress Notes (Signed)
HPI:  Ms. Morro was previously seen in the [facility] due to a family history of family history of breast cancer and concerns regarding a hereditary predisposition to cancer. Please refer to our prior cancer genetics clinic note for more information regarding our discussion, assessment and recommendations, at the time. Ms. Brinker recent genetic test results were disclosed to her, as were recommendations warranted by these results. These results and recommendations are discussed in more detail below.  CANCER HISTORY:  Ms. Riddell is a 29 year old female without a personal history of cancer.   FAMILY HISTORY:  We obtained a detailed, 4-generation family history.  Significant diagnoses are listed below: Family History  Problem Relation Age of Onset  . Breast cancer Maternal Aunt        dx late 72s  . Liver cancer Cousin        maternal cousin; dx 55s         Ms. Milich has one son and one daughter, ages 26 and 27.  She does not have any siblings.  Her mother is 35 years old, and her father is 62 years old.  Ms. Ramone has one maternal aunt, now age 49, who was diagnosed with breast cancer in her late 19s.  Ms. Alfredo has one maternal cousin who was diagnosed with liver cancer in his 36s.  She has limited information about her maternal family.  She does not report any other maternal or paternal family history of cancer.   Ms. Ridinger is unaware of previous family history of genetic testing for hereditary cancer risks. Patient's maternal ancestors are of [place] descent, and paternal ancestors are of German/[place] descent. There is no reported Ashkenazi Jewish ancestry. There is no known consanguinity.  GENETIC TEST RESULTS: Genetic testing reported out on May 02, 2020.  The [facility] Multi-Cancer Panel found no pathogenic mutations. The Multi-Cancer Panel offered by [facility] includes sequencing and/or deletion duplication testing of the following 85 genes: AIP, ALK, APC,  ATM, AXIN2,BAP1,  BARD1, BLM, BMPR1A, BRCA1, BRCA2, BRIP1, CASR, CDC73, CDH1, CDK4, CDKN1B, CDKN1C, CDKN2A (p14ARF), CDKN2A (p16INK4a), CEBPA, CHEK2, CTNNA1, DICER1, DIS3L2, EGFR (c.2369C>T, p.Thr790Met variant only), EPCAM (Deletion/duplication testing only), FH, FLCN, GATA2, GPC3, GREM1 (Promoter region deletion/duplication testing only), HOXB13 (c.251G>A, p.Gly84Glu), HRAS, KIT, MAX, MEN1, MET, MITF (c.952G>A, p.Glu318Lys variant only), MLH1, MSH2, MSH3, MSH6, MUTYH, NBN, NF1, NF2, NTHL1, PALB2, PDGFRA, PHOX2B, PMS2, POLD1, POLE, POT1, PRKAR1A, PTCH1, PTEN, RAD50, RAD51C, RAD51D, RB1, RECQL4, RET, RNF43, RUNX1, SDHAF2, SDHA (sequence changes only), SDHB, SDHC, SDHD, SMAD4, SMARCA4, SMARCB1, SMARCE1, STK11, SUFU, TERC, TERT, TMEM127, TP53, TSC1, TSC2, VHL, WRN and WT1.    The test report has been scanned into EPIC and is located under the Molecular Pathology section of the Results Review tab.  A portion of the result report is included below for reference.     We discussed with Ms. Killian that because current genetic testing is not perfect, it is possible there may be a gene mutation in one of these genes that current testing cannot detect, but that chance is small.  We also discussed, that there could be another gene that has not yet been discovered, or that we have not yet tested, that is responsible for the cancer diagnoses in the family. It is also possible there is a hereditary cause for the cancer in the family that Ms. Gowell did not inherit and therefore was not identified in her testing.  Therefore, it is important to remain in touch with cancer genetics  in the future so that we can continue to offer Ms. Rog the most up to date genetic testing.   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  At this time, it is unknown if this variant is associated with increased cancer risk or if this is a normal finding,  but most variants such as this get reclassified to being inconsequential. It should not be used to make medical management decisions. With time, we suspect the lab will determine the significance of this variant, if any. If we do learn more about it, we will try to contact Ms. Macomber to discuss it further. However, it is important to stay in touch with [place] periodically and keep the address and phone number up to date.    ADDITIONAL GENETIC TESTING: We discussed with [name] that her genetic testing was fairly extensive.  If there are genes identified to increase cancer risk that can be analyzed in the future, we would be happy to discuss and coordinate this testing at that time.    CANCER SCREENING RECOMMENDATIONS: Ms. Tidd test result is considered negative (normal).  This means that we have not identified a hereditary cause for her family history of breast cancer at this time. Most cancers happen by chance and this negative test suggests that her family's cancer could fall into this category.    While reassuring, this does not definitively rule out a hereditary predisposition to cancer. It is still possible that there could be genetic mutations that are undetectable by current technology. There could be genetic mutations in genes that have not been tested or identified to increase cancer risk.  Therefore, it is recommended she continue to follow the cancer management and screening guidelines provided by her primary healthcare provider.   An individual's cancer risk and medical management are not determined by genetic test results alone. Overall cancer risk assessment incorporates additional factors, including personal medical history, family history, and any available genetic information that may result in a personalized plan for cancer prevention and surveillance  RECOMMENDATIONS FOR FAMILY MEMBERS:  Individuals in this family might be at some increased risk of developing cancer, over the general  population risk, simply due to the family history of cancer.  We recommended women in this family have a yearly mammogram beginning at age 45, or 10 years younger than the earliest onset of cancer, an annual clinical breast exam, and perform monthly breast self-exams. Women in this family should also have a gynecological exam as recommended by their primary provider. All family members should be referred for colonoscopy starting at age 14.  It is also possible there is a hereditary cause for the cancer in Ms. Crossen's family that she did not inherit and therefore was not identified in her.  Based on Ms. Turay's family history, we recommended her maternal aunt, who was diagnosed with breast cancer in her 48s, have genetic counseling. Ms. Wallis will let us know if we can be of any assistance in coordinating genetic counseling for this family member.   FOLLOW-UP: Lastly, we discussed with Ms. Baker that cancer genetics is a rapidly advancing field and it is possible that new genetic tests will be appropriate for her and/or her family members in the future. We encouraged her to remain in contact with cancer genetics on an annual basis so we can update her personal and family histories and let her know of advances in cancer genetics that may  benefit this family.   Our contact number was provided. Ms. Dress questions were answered to her satisfaction, and she knows she is welcome to call us at anytime with additional questions or concerns.   Lyrique Hakim M. Joette Catching, [place], [facility] Certified Film/video [email or web address] (P) [phone]

## 2020-05-30 DIAGNOSIS — M9901 Segmental and somatic dysfunction of cervical region: Secondary | ICD-10-CM | POA: Diagnosis not present

## 2020-05-30 DIAGNOSIS — M531 Cervicobrachial syndrome: Secondary | ICD-10-CM | POA: Diagnosis not present

## 2020-05-30 DIAGNOSIS — M5414 Radiculopathy, thoracic region: Secondary | ICD-10-CM | POA: Diagnosis not present

## 2020-05-30 DIAGNOSIS — M9902 Segmental and somatic dysfunction of thoracic region: Secondary | ICD-10-CM | POA: Diagnosis not present

## 2020-06-06 DIAGNOSIS — M9901 Segmental and somatic dysfunction of cervical region: Secondary | ICD-10-CM | POA: Diagnosis not present

## 2020-06-06 DIAGNOSIS — M9902 Segmental and somatic dysfunction of thoracic region: Secondary | ICD-10-CM | POA: Diagnosis not present

## 2020-06-06 DIAGNOSIS — M5414 Radiculopathy, thoracic region: Secondary | ICD-10-CM | POA: Diagnosis not present

## 2020-06-06 DIAGNOSIS — M531 Cervicobrachial syndrome: Secondary | ICD-10-CM | POA: Diagnosis not present

## 2020-06-26 DIAGNOSIS — M9902 Segmental and somatic dysfunction of thoracic region: Secondary | ICD-10-CM | POA: Diagnosis not present

## 2020-06-26 DIAGNOSIS — M5414 Radiculopathy, thoracic region: Secondary | ICD-10-CM | POA: Diagnosis not present

## 2020-06-26 DIAGNOSIS — M531 Cervicobrachial syndrome: Secondary | ICD-10-CM | POA: Diagnosis not present

## 2020-06-26 DIAGNOSIS — M9901 Segmental and somatic dysfunction of cervical region: Secondary | ICD-10-CM | POA: Diagnosis not present

## 2020-08-07 DIAGNOSIS — Z20822 Contact with and (suspected) exposure to covid-19: Secondary | ICD-10-CM | POA: Diagnosis not present

## 2020-08-07 DIAGNOSIS — Z03818 Encounter for observation for suspected exposure to other biological agents ruled out: Secondary | ICD-10-CM | POA: Diagnosis not present

## 2020-08-14 DIAGNOSIS — Z03818 Encounter for observation for suspected exposure to other biological agents ruled out: Secondary | ICD-10-CM | POA: Diagnosis not present

## 2020-09-01 DIAGNOSIS — M9902 Segmental and somatic dysfunction of thoracic region: Secondary | ICD-10-CM | POA: Diagnosis not present

## 2020-09-01 DIAGNOSIS — M9903 Segmental and somatic dysfunction of lumbar region: Secondary | ICD-10-CM | POA: Diagnosis not present

## 2020-09-01 DIAGNOSIS — M9901 Segmental and somatic dysfunction of cervical region: Secondary | ICD-10-CM | POA: Diagnosis not present

## 2020-09-01 DIAGNOSIS — M542 Cervicalgia: Secondary | ICD-10-CM | POA: Diagnosis not present

## 2020-09-02 ENCOUNTER — Telehealth (INDEPENDENT_AMBULATORY_CARE_PROVIDER_SITE_OTHER): Payer: BC Managed Care – PPO | Admitting: Osteopathic Medicine

## 2020-09-02 ENCOUNTER — Encounter: Payer: Self-pay | Admitting: Osteopathic Medicine

## 2020-09-02 VITALS — Wt 190.0 lb

## 2020-09-02 DIAGNOSIS — R197 Diarrhea, unspecified: Secondary | ICD-10-CM | POA: Diagnosis not present

## 2020-09-02 DIAGNOSIS — K580 Irritable bowel syndrome with diarrhea: Secondary | ICD-10-CM | POA: Diagnosis not present

## 2020-09-02 MED ORDER — RIFAXIMIN 200 MG PO TABS: 200.0000 mg | ORAL_TABLET | Freq: Three times a day (TID) | ORAL | 0 refills | Status: AC

## 2020-09-02 NOTE — Patient Instructions (Addendum)
I suspect Irritable Bowel Syndrome w/ Diarrhea (aka IBS-D).   Labs: Blood work and stool specimen to rule out other causes of persistent diarrhea  Medications: Imodium is fine to take as needed.  I also sent Rx to trial for 2 weeks, Xifaxan is an antibiotic that can regulate/reset the bacteria in your intestines, and get IBS-D to clear up. There are also other, long-term prescriptions we can try if this doesn't work, or if it only works temporarily.   Diet changes:  See Low FODMAP diet below for foods to avoid, potential IBS-D triggers!  If worse/change, especially if severe pain or if blood in the stool, please let us know or seek emergency medical attention if needed!      2017 UpToDate Characteristics and sources of common FODMAPs  Word that corresponds to letter in acronym Compounds in this category Foods that contain these compounds  F Fermentable  O Oligosaccharides Fructans, galacto-oligosaccharides Wheat, barley, rye, onion, leek, white part of spring onion, garlic, shallots, artichokes, beetroot, fennel, peas, chicory, pistachio, cashews, legumes, lentils, and chickpeas    D Disaccharides Lactose Milk, custard, ice cream, and yogurt    M Monosaccharides "Free fructose" (fructose in excess of glucose) Apples, pears, mangoes, cherries, watermelon, asparagus, sugar snap peas, honey, high-fructose corn syrup    A And  P Polyols Sorbitol, mannitol, maltitol, and xylitol Apples, pears, apricots, cherries, nectarines, peaches, plums, watermelon, mushrooms, cauliflower, artificially sweetened chewing gum and confectionery    FODMAPs: fermentable oligosaccharides, disaccharides, monosaccharides, and polyols. Adapted by permission from [employer]: [employer]. Lonell Face, [employer], [place]. Short-chain carbohydrates and functional gastrointestinal disorders. Am J Gastroenterol 2013; 108:707. Copyright  2013. www.nature.com/ajg. Graphic  76734 Version 2.0

## 2020-09-02 NOTE — Progress Notes (Signed)
Telemedicine Visit via  Video & Audio (App used: MyChart)   I connected with Caitlyn Jackson on 09/02/20 at 9:59 AM  by phone or  telemedicine application as noted above  I verified that I am speaking with or regarding  the correct patient using two identifiers.  Participants: Myself, Dr Sunnie Nielsen DO Patient: Caitlyn Jackson Patient proxy if applicable: none Other, if applicable: none  Patient is in separate location from myself  I am in office at [facility]    I discussed the limitations of evaluation and management  by telemedicine and the availability of in person appointments.  The participant(s) above expressed understanding and  agreed to proceed with this appointment via telemedicine.       History of Present Illness: Caitlyn Jackson is a 29 y.o. female who would like to discuss ongoing diarrhea, GI problems. Started a few weeks ago getting worse, but has been ongoing on and off about a year. Cut out red meat, cut out dairy. More recently went back on dairy and did fine. Scared to go out d/t concern for accidents. Stopped all OTC supplements except probiotic. Pain usually at night, epigastric area, feels like "expanding" right before and during the diarrhea. BM improves the pain. Having BM 3x/2 hours at most. Typical BM is 1 in AM, sometimes 1 in PM. Imodium helped, normal BM on Monday.        Observations/Objective: Wt 190 lb (86.2 kg)   BMI 33.66 kg/m  BP Readings from Last 3 Encounters:  04/04/20 121/82  12/11/19 118/78  07/20/19 98/64   Exam: Normal Speech.  NAD  Lab and Radiology Results No results found for this or any previous visit (from the past 72 hour(s)). No results found.     Assessment and Plan: 29 y.o. female with The primary encounter diagnosis was Diarrhea, unspecified type. A diagnosis of Irritable bowel syndrome with diarrhea - moderate/severe without constipation, failed conservative treatment, dietary  modifications and OTC Rx  was also pertinent to this visit.   PDMP not reviewed this encounter. Orders Placed This Encounter  Procedures  . Stool Culture  . Ova and parasite examination  . CBC  . COMPLETE METABOLIC PANEL WITH GFR  . TSH  . Magnesium  . Stool, WBC/Lactoferrin   Meds ordered this encounter  Medications  . rifaximin (XIFAXAN) 200 MG tablet    Sig: Take 1 tablet (200 mg total) by mouth 3 (three) times daily for 14 days.    Dispense:  42 tablet    Refill:  0   Patient Instructions   I suspect Irritable Bowel Syndrome w/ Diarrhea (aka IBS-D).   Labs: Blood work and stool specimen to rule out other causes of persistent diarrhea  Medications: Imodium is fine to take as needed.  I also sent Rx to trial for 2 weeks, Xifaxan is an antibiotic that can regulate/reset the bacteria in your intestines, and get IBS-D to clear up. There are also other, long-term prescriptions we can try if this doesn't work, or if it only works temporarily.   Diet changes:  See Low FODMAP diet below for foods to avoid, potential IBS-D triggers!  If worse/change, especially if severe pain or if blood in the stool, please let us know or seek emergency medical attention if needed!      2017 UpToDate Characteristics and sources of common FODMAPs  Word that corresponds to letter in acronym Compounds in this category Foods that contain these compounds  F Fermentable  O Oligosaccharides Fructans, galacto-oligosaccharides Wheat, barley, rye, onion, leek, white part of spring onion, garlic, shallots, artichokes, beetroot, fennel, peas, chicory, pistachio, cashews, legumes, lentils, and chickpeas    D Disaccharides Lactose Milk, custard, ice cream, and yogurt    M Monosaccharides "Free fructose" (fructose in excess of glucose) Apples, pears, mangoes, cherries, watermelon, asparagus, sugar snap peas, honey, high-fructose corn syrup    A And  P Polyols Sorbitol, mannitol, maltitol, and  xylitol Apples, pears, apricots, cherries, nectarines, peaches, plums, watermelon, mushrooms, cauliflower, artificially sweetened chewing gum and confectionery    FODMAPs: fermentable oligosaccharides, disaccharides, monosaccharides, and polyols. Adapted by permission from [employer]: [employer]. Lonell Face, [employer], [place]. Short-chain carbohydrates and functional gastrointestinal disorders. Am J Gastroenterol 2013; 108:707. Copyright  2013. www.nature.com/ajg. Graphic 89169 Version 2.0     Instructions sent via MyChart.   Follow Up Instructions: Return in about 3 weeks (around 09/23/2020) for VIRTUAL *OR* IN-OFFICE VISIT RECHECK DIARRHEA / LIKELY IBS.    I discussed the assessment and treatment plan with the patient. The patient was provided an opportunity to ask questions and all were answered. The patient agreed with the plan and demonstrated an understanding of the instructions.   The patient was advised to call back or seek an in-person evaluation if any new concerns, if symptoms worsen or if the condition fails to improve as anticipated.  30 minutes of non-face-to-face time was provided during this encounter.      . . . . . . . . . . . . . Marland Kitchen                   Historical information moved to improve visibility of documentation.  Past Medical History:  Diagnosis Date  . Rib pain on left side 07/20/2019   Past Surgical History:  Procedure Laterality Date  . CESAREAN SECTION     2014/2017   Social History   Tobacco Use  . Smoking status: Never Smoker  . Smokeless tobacco: Never Used  Substance Use Topics  . Alcohol use: Never   family history includes Breast cancer in her maternal aunt; High blood pressure in her mother; Liver cancer in her cousin.  Medications: Current Outpatient Medications  Medication Sig Dispense Refill  . clonazePAM (KLONOPIN) 0.5 MG tablet Take 0.5-1 tablets (0.25-0.5  mg total) by mouth 3 (three) times daily as needed for anxiety. 10 tablet 0  . rifaximin (XIFAXAN) 200 MG tablet Take 1 tablet (200 mg total) by mouth 3 (three) times daily for 14 days. 42 tablet 0   No current facility-administered medications for this visit.   Allergies  Allergen Reactions  . Cefoxitin Hives  . Cefuroxime Axetil Hives, Other (See Comments) and Rash    Hives that turn to open sores

## 2020-09-03 ENCOUNTER — Telehealth: Payer: Self-pay

## 2020-09-03 DIAGNOSIS — R197 Diarrhea, unspecified: Secondary | ICD-10-CM | POA: Diagnosis not present

## 2020-09-03 DIAGNOSIS — K580 Irritable bowel syndrome with diarrhea: Secondary | ICD-10-CM | POA: Diagnosis not present

## 2020-09-03 LAB — CBC
HCT: 39.6 % (ref 35.0–45.0)
Hemoglobin: 13.2 g/dL (ref 11.7–15.5)
MCH: 30.1 pg (ref 27.0–33.0)
MCHC: 33.3 g/dL (ref 32.0–36.0)
MCV: 90.4 fL (ref 80.0–100.0)
MPV: 10.7 fL (ref 7.5–12.5)
Platelets: 275 10*3/uL (ref 140–400)
RBC: 4.38 10*6/uL (ref 3.80–5.10)
RDW: 12.5 % (ref 11.0–15.0)
WBC: 6.9 10*3/uL (ref 3.8–10.8)

## 2020-09-03 LAB — COMPLETE METABOLIC PANEL WITH GFR
AG Ratio: 1.7 (calc) (ref 1.0–2.5)
ALT: 19 U/L (ref 6–29)
AST: 14 U/L (ref 10–30)
Albumin: 4.3 g/dL (ref 3.6–5.1)
Alkaline phosphatase (APISO): 109 U/L (ref 31–125)
BUN: 9 mg/dL (ref 7–25)
CO2: 28 mmol/L (ref 20–32)
Calcium: 9.5 mg/dL (ref 8.6–10.2)
Chloride: 105 mmol/L (ref 98–110)
Creat: 0.74 mg/dL (ref 0.50–1.10)
GFR, Est African American: 125 mL/min/{1.73_m2} (ref >=60)
GFR, Est Non African American: 108 mL/min/{1.73_m2} (ref >=60)
Globulin: 2.5 g/dL (calc) (ref 1.9–3.7)
Glucose, Bld: 85 mg/dL (ref 65–139)
Potassium: 4.3 mmol/L (ref 3.5–5.3)
Sodium: 139 mmol/L (ref 135–146)
Total Bilirubin: 0.4 mg/dL (ref 0.2–1.2)
Total Protein: 6.8 g/dL (ref 6.1–8.1)

## 2020-09-03 LAB — MAGNESIUM: Magnesium: 2.1 mg/dL (ref 1.5–2.5)

## 2020-09-03 LAB — TSH: TSH: 1.36 mIU/L

## 2020-09-03 MED ORDER — COLESEVELAM HCL 625 MG PO TABS: 625.0000 mg | ORAL_TABLET | Freq: Two times a day (BID) | ORAL | 1 refills | Status: DC

## 2020-09-03 NOTE — Telephone Encounter (Signed)
Pt called stating that rifaximin will require a prior authorization. She is requesting for provider to send in an alternative rx to the pharmacy.

## 2020-09-03 NOTE — Telephone Encounter (Signed)
Would still try to get the Xifaxan approved, sent Welchol instead but this may require long term treatment as oppsoed to 2 week course of Rx

## 2020-09-04 NOTE — Telephone Encounter (Signed)
Called patient to let her know Welchol medication had been called into the pharmacy.  Patient had already picked the medication up and has been  reading the pamphlet on the medication and has concerns due to it stating the medication is used for lowering cholesterol and blood pressure. Should the patient be concerned about this.  Please Advise!

## 2020-09-04 NOTE — Telephone Encounter (Signed)
It's also used for IBS-D OK to take.Marland KitchenMarland KitchenWhich is why I prescribed it There should not be any significant effect on BP

## 2020-09-04 NOTE — Telephone Encounter (Signed)
LM on voicemail @ 2:37pm with below instructions.  If she had any questions, I told her to call us back.

## 2020-09-09 DIAGNOSIS — Z20828 Contact with and (suspected) exposure to other viral communicable diseases: Secondary | ICD-10-CM | POA: Diagnosis not present

## 2020-09-09 LAB — OVA AND PARASITE EXAMINATION
CONCENTRATE RESULT:: NONE SEEN
MICRO NUMBER:: 11366978
SPECIMEN QUALITY:: ADEQUATE
TRICHROME RESULT:: NONE SEEN

## 2020-09-09 LAB — FECAL LACTOFERRIN, QUANT
Fecal Lactoferrin: NEGATIVE
MICRO NUMBER:: 11368651
SPECIMEN QUALITY:: ADEQUATE

## 2020-09-24 ENCOUNTER — Telehealth (INDEPENDENT_AMBULATORY_CARE_PROVIDER_SITE_OTHER): Payer: BC Managed Care – PPO | Admitting: Osteopathic Medicine

## 2020-09-24 DIAGNOSIS — Z5329 Procedure and treatment not carried out because of patient's decision for other reasons: Secondary | ICD-10-CM

## 2020-09-24 DIAGNOSIS — Z91199 Patient's noncompliance with other medical treatment and regimen due to unspecified reason: Secondary | ICD-10-CM

## 2020-09-26 NOTE — Progress Notes (Signed)
Late cancel

## 2020-10-13 ENCOUNTER — Other Ambulatory Visit: Payer: Self-pay

## 2020-10-13 MED ORDER — COLESEVELAM HCL 625 MG PO TABS: 625.0000 mg | ORAL_TABLET | Freq: Two times a day (BID) | ORAL | 1 refills | Status: DC

## 2020-10-13 MED ORDER — CLONAZEPAM 0.5 MG PO TABS: 0.2500 mg | ORAL_TABLET | Freq: Three times a day (TID) | ORAL | 0 refills | Status: DC | PRN

## 2020-10-13 NOTE — Telephone Encounter (Signed)
Pt called requesting rx refill for clonazepam. Rx pended. Pls advise,thanks.

## 2020-10-17 ENCOUNTER — Telehealth: Payer: Self-pay

## 2020-10-17 NOTE — Telephone Encounter (Signed)
Pt called requesting information regarding the colesevelam rx. She wants to know if the rx will be long term? Pt did mention that she is doing a lot better with the medication. Pls advise, thanks.

## 2020-10-22 ENCOUNTER — Encounter: Payer: Self-pay | Admitting: Osteopathic Medicine

## 2020-10-22 MED ORDER — COLESEVELAM HCL 625 MG PO TABS: 625.0000 mg | ORAL_TABLET | Freq: Two times a day (BID) | ORAL | 3 refills | Status: DC

## 2020-10-22 NOTE — Telephone Encounter (Signed)
MyChart message sent to patient.

## 2020-11-29 DIAGNOSIS — R519 Headache, unspecified: Secondary | ICD-10-CM | POA: Diagnosis not present

## 2020-11-29 DIAGNOSIS — R112 Nausea with vomiting, unspecified: Secondary | ICD-10-CM | POA: Diagnosis not present

## 2020-11-29 DIAGNOSIS — G43109 Migraine with aura, not intractable, without status migrainosus: Secondary | ICD-10-CM | POA: Diagnosis not present

## 2020-11-29 DIAGNOSIS — Z79899 Other long term (current) drug therapy: Secondary | ICD-10-CM | POA: Diagnosis not present

## 2020-11-29 DIAGNOSIS — Z87891 Personal history of nicotine dependence: Secondary | ICD-10-CM | POA: Diagnosis not present

## 2020-12-02 ENCOUNTER — Encounter: Payer: Self-pay | Admitting: Osteopathic Medicine

## 2020-12-02 ENCOUNTER — Telehealth (INDEPENDENT_AMBULATORY_CARE_PROVIDER_SITE_OTHER): Payer: BC Managed Care – PPO | Admitting: Osteopathic Medicine

## 2020-12-02 DIAGNOSIS — G43109 Migraine with aura, not intractable, without status migrainosus: Secondary | ICD-10-CM | POA: Diagnosis not present

## 2020-12-02 MED ORDER — SUMATRIPTAN SUCCINATE 50 MG PO TABS: 25.0000 mg | ORAL_TABLET | ORAL | 0 refills | Status: DC | PRN

## 2020-12-02 NOTE — Progress Notes (Signed)
Telemedicine Visit via  Video & Audio (App used: MyChart)   I connected with Caitlyn Jackson on 12/02/20 at 3:37 PM  by phone or  telemedicine application as noted above  I verified that I am speaking with or regarding  the correct patient using two identifiers.  Participants: Myself, Dr Sunnie Nielsen DO Patient: Caitlyn Jackson Patient proxy if applicable: none Other, if applicable: none  Patient is at home I am in office at [facility]    I discussed the limitations of evaluation and management  by telemedicine and the availability of in person appointments.  The participant(s) above expressed understanding and  agreed to proceed with this appointment via telemedicine.       History of Present Illness: Caitlyn Jackson is a 30 y.o. female who would like to discuss MIGRAINE   Recent ER visit for migraine L sided head pain starting in occipital area and then radiated toward frontal, preceded by spots in visual periphery, associated w/ nausea/vomiting, photo- and phono-phobia. No previous migraines. No known triggers. Went to ED. Headache responded to IV benadryl + compazine   Observations/Objective: There were no vitals taken for this visit. BP Readings from Last 3 Encounters:  04/04/20 121/82  12/11/19 118/78  07/20/19 98/64   Exam: Normal Speech.  NAD  Lab and Radiology Results No results found for this or any previous visit (from the past 72 hour(s)). No results found.     Assessment and Plan: 30 y.o. female with The encounter diagnosis was Migraine with aura and without status migrainosus, not intractable.  New onset headache meets criteria for migraine Headache diary app encouraged Imitrex Rx sent, discussed benefits vs potential risk/AE RTC prn if needing Imitrex 4+ times per month or if HA change/worsen   PDMP not reviewed this encounter. No orders of the defined types were placed in this encounter.  Meds ordered this encounter   Medications  . SUMAtriptan (IMITREX) 50 MG tablet    Sig: Take 0.5-1 tablets (25-50 mg total) by mouth every 2 (two) hours as needed for migraine. May repeat in 2 hours if headache persists or recurs.    Dispense:  10 tablet    Refill:  0   There are no Patient Instructions on file for this visit.  Instructions sent via MyChart.   Follow Up Instructions: Return if symptoms worsen or fail to improve.    I discussed the assessment and treatment plan with the patient. The patient was provided an opportunity to ask questions and all were answered. The patient agreed with the plan and demonstrated an understanding of the instructions.   The patient was advised to call back or seek an in-person evaluation if any new concerns, if symptoms worsen or if the condition fails to improve as anticipated.  30 minutes of non-face-to-face time was provided during this encounter.      . . . . . . . . . . . . . Marland Kitchen                   Historical information moved to improve visibility of documentation.  Past Medical History:  Diagnosis Date  . Rib pain on left side 07/20/2019   Past Surgical History:  Procedure Laterality Date  . CESAREAN SECTION     2014/2017   Social History   Tobacco Use  . Smoking status: Never Smoker  . Smokeless tobacco: Never Used  Substance Use Topics  . Alcohol use: Never   family history  includes Breast cancer in her maternal aunt; High blood pressure in her mother; Liver cancer in her cousin.  Medications: Current Outpatient Medications  Medication Sig Dispense Refill  . SUMAtriptan (IMITREX) 50 MG tablet Take 0.5-1 tablets (25-50 mg total) by mouth every 2 (two) hours as needed for migraine. May repeat in 2 hours if headache persists or recurs. 10 tablet 0  . clonazePAM (KLONOPIN) 0.5 MG tablet Take 0.5-1 tablets (0.25-0.5 mg total) by mouth 3 (three) times daily as needed for anxiety. 10 tablet 0  . colesevelam (WELCHOL) 625 MG  tablet Take 1 tablet (625 mg total) by mouth 2 (two) times daily with a meal. 180 tablet 3   No current facility-administered medications for this visit.   Allergies  Allergen Reactions  . Cefoxitin Hives  . Cefuroxime Axetil Hives, Other (See Comments) and Rash    Hives that turn to open sores       If phone visit, billing and coding can please add appropriate modifier if needed

## 2020-12-03 ENCOUNTER — Telehealth: Payer: Self-pay

## 2020-12-03 NOTE — Telephone Encounter (Signed)
Pt called requesting if there is an alternative rx to imitrex? Per pt, she had a bad reaction to the rx after taking it. She does not plan on taking the medication. Pls advise, thanks.

## 2020-12-04 MED ORDER — NURTEC 75 MG PO TBDP: 75.0000 mg | ORAL_TABLET | Freq: Once | ORAL | 3 refills | Status: DC | PRN

## 2020-12-04 NOTE — Telephone Encounter (Signed)
Called in Nurtec Might need a PA - see Rx notes, pt intolerant to triptan = should cover Nurtec Please ask pt to look up Nurtec online for savings card

## 2020-12-05 DIAGNOSIS — M9901 Segmental and somatic dysfunction of cervical region: Secondary | ICD-10-CM | POA: Diagnosis not present

## 2020-12-05 DIAGNOSIS — M47811 Spondylosis without myelopathy or radiculopathy, occipito-atlanto-axial region: Secondary | ICD-10-CM | POA: Diagnosis not present

## 2020-12-05 DIAGNOSIS — M9902 Segmental and somatic dysfunction of thoracic region: Secondary | ICD-10-CM | POA: Diagnosis not present

## 2020-12-05 DIAGNOSIS — M5481 Occipital neuralgia: Secondary | ICD-10-CM | POA: Diagnosis not present

## 2020-12-05 NOTE — Telephone Encounter (Signed)
Left a detailed vm msg for patient regarding provider's note. Informed of prior authorization process in case required for Nurtec rx. Direct call back provided.

## 2020-12-08 ENCOUNTER — Telehealth: Payer: Self-pay

## 2020-12-08 NOTE — Telephone Encounter (Signed)
Pt left a vm msg stating she took the Nurtec rx and still experienced some bad reaction to the med. Pt is requesting other options and alternatives. Pls advise, thanks.

## 2020-12-08 NOTE — Telephone Encounter (Signed)
Needs visit this would be third emd tried - need to go over options etc

## 2020-12-10 DIAGNOSIS — M5481 Occipital neuralgia: Secondary | ICD-10-CM | POA: Diagnosis not present

## 2020-12-10 DIAGNOSIS — M47811 Spondylosis without myelopathy or radiculopathy, occipito-atlanto-axial region: Secondary | ICD-10-CM | POA: Diagnosis not present

## 2020-12-10 DIAGNOSIS — M9901 Segmental and somatic dysfunction of cervical region: Secondary | ICD-10-CM | POA: Diagnosis not present

## 2020-12-10 DIAGNOSIS — M9902 Segmental and somatic dysfunction of thoracic region: Secondary | ICD-10-CM | POA: Diagnosis not present

## 2020-12-10 NOTE — Telephone Encounter (Signed)
Contacted the patient regarding provider's recommendation. Per pt, due to financial restraints, it is difficult for her to make an appointment virtually or in office. Pt mention that Nurtec was causing her sensations of tingling pressure that come and goes with anxiety. Pt was wondering if her symptoms are anxiety related? Can she take clonazepam to help with her symptoms. She also wants to switch from colesevalm (no longer working) to loperimide.

## 2020-12-11 ENCOUNTER — Telehealth: Payer: Self-pay

## 2020-12-11 NOTE — Telephone Encounter (Signed)
Prior authorization for Nurtec submitted to insurance. Pending a determination.

## 2020-12-12 DIAGNOSIS — M9901 Segmental and somatic dysfunction of cervical region: Secondary | ICD-10-CM | POA: Diagnosis not present

## 2020-12-12 DIAGNOSIS — M5481 Occipital neuralgia: Secondary | ICD-10-CM | POA: Diagnosis not present

## 2020-12-12 DIAGNOSIS — M9902 Segmental and somatic dysfunction of thoracic region: Secondary | ICD-10-CM | POA: Diagnosis not present

## 2020-12-12 DIAGNOSIS — M47811 Spondylosis without myelopathy or radiculopathy, occipito-atlanto-axial region: Secondary | ICD-10-CM | POA: Diagnosis not present

## 2020-12-15 ENCOUNTER — Telehealth: Payer: Self-pay | Admitting: *Deleted

## 2020-12-15 DIAGNOSIS — M9902 Segmental and somatic dysfunction of thoracic region: Secondary | ICD-10-CM | POA: Diagnosis not present

## 2020-12-15 DIAGNOSIS — M47811 Spondylosis without myelopathy or radiculopathy, occipito-atlanto-axial region: Secondary | ICD-10-CM | POA: Diagnosis not present

## 2020-12-15 DIAGNOSIS — M5481 Occipital neuralgia: Secondary | ICD-10-CM | POA: Diagnosis not present

## 2020-12-15 DIAGNOSIS — M9901 Segmental and somatic dysfunction of cervical region: Secondary | ICD-10-CM | POA: Diagnosis not present

## 2020-12-15 MED ORDER — RIZATRIPTAN BENZOATE 10 MG PO TABS: 5.0000 mg | ORAL_TABLET | ORAL | 0 refills | Status: DC | PRN

## 2020-12-15 NOTE — Telephone Encounter (Signed)
maxalt sent for migraines Can take loperamide (= OTC Imodium) Any further issues will require visit, sorry

## 2020-12-15 NOTE — Telephone Encounter (Signed)
lvm asking that pt return call about the medication that she was inquiring about.   Last note was regarding the Nurtec which is waiting authorization from insurance.

## 2020-12-16 ENCOUNTER — Other Ambulatory Visit: Payer: Self-pay | Admitting: *Deleted

## 2020-12-16 NOTE — Telephone Encounter (Signed)
Spoke again with pt and advised her that Dr. Lyn Hollingshead would be happy to f/u with her to discuss her concerns and that she would need to schedule an appt to do this.   Pt advised that she can purchase the Loperamide OTC.   With regards to the Anxiety medication she would need to discuss this with her in person because she will not give additional benzodiazepines for her to take.  Pt scheduled for 4/14 for an in office appt to discuss treatment plan.

## 2020-12-16 NOTE — Telephone Encounter (Signed)
Left message advising of recommendations.  

## 2020-12-16 NOTE — Telephone Encounter (Signed)
Spoke w/pt and she said that she was unable to take any triptans or the Nurtec that was sent. She stated that none of the medications have worked for her and that when she was seen by her chiropractor that Dr. Lyn Hollingshead would essentially be prescribing medications to her to "see what will stick, like throwing darts on a dart board".  I informed the pt that unfortunately there is some truth to that statement however, if one medication doesn't work then yes she would need to prescribe something else until there is something that actually works for her.   I told her that she could very well benefit by seeing either a headache specialist or Neurology since this is their specialty and that they work with patients on a daily basis who suffer with migraines and being as such we could certainly referrer to either one of those specialties if she wanted [place] to.   Pt did not wish to choose that option at this time.   She then proceeded to inquire about switching her Colesevelam to Loperamide prescription because her father has the same issues that she does and he benefits well with this. And would also like something for her Anxiety. I immediately informed her that Dr. Lyn Hollingshead would NOT be giving her any additional Xanax due to the high abuse level of this medication.   Pt was advised that due to having all of this inquiries she should schedule an appt with Dr. Lyn Hollingshead to discuss her concerns.  She said that she is really not able to afford the office visits.   Pt informed that I would fwd her concerns to pcp and f/u with her.

## 2020-12-17 DIAGNOSIS — M9901 Segmental and somatic dysfunction of cervical region: Secondary | ICD-10-CM | POA: Diagnosis not present

## 2020-12-17 DIAGNOSIS — M47811 Spondylosis without myelopathy or radiculopathy, occipito-atlanto-axial region: Secondary | ICD-10-CM | POA: Diagnosis not present

## 2020-12-17 DIAGNOSIS — M9902 Segmental and somatic dysfunction of thoracic region: Secondary | ICD-10-CM | POA: Diagnosis not present

## 2020-12-17 DIAGNOSIS — M5481 Occipital neuralgia: Secondary | ICD-10-CM | POA: Diagnosis not present

## 2020-12-18 ENCOUNTER — Other Ambulatory Visit: Payer: Self-pay

## 2020-12-18 ENCOUNTER — Encounter: Payer: Self-pay | Admitting: Osteopathic Medicine

## 2020-12-18 ENCOUNTER — Ambulatory Visit (INDEPENDENT_AMBULATORY_CARE_PROVIDER_SITE_OTHER): Payer: BC Managed Care – PPO | Admitting: Osteopathic Medicine

## 2020-12-18 VITALS — BP 105/67 | HR 88 | Temp 98.4°F | Wt 185.0 lb

## 2020-12-18 DIAGNOSIS — G43109 Migraine with aura, not intractable, without status migrainosus: Secondary | ICD-10-CM

## 2020-12-18 MED ORDER — AMITRIPTYLINE HCL 10 MG PO TABS: 10.0000 mg | ORAL_TABLET | Freq: Every evening | ORAL | 0 refills | Status: DC | PRN

## 2020-12-18 MED ORDER — FROVATRIPTAN SUCCINATE 2.5 MG PO TABS: 2.5000 mg | ORAL_TABLET | ORAL | 0 refills | Status: DC | PRN

## 2020-12-18 NOTE — Patient Instructions (Signed)
PLAN:   Anxiety/panic: ok to take Klonopin aka clonazepam as directed  Sleep: ok to take Elavil aka amitriptyline 10-50 mg (1-5 pills) around evening/bedtime to help with insomnia.   Migraine: try Ibuprofen 800 mg + something with caffeine + Benadryl 50 mg along with that. If that's not helping, use the Frova aka frovatriptan which is less potent than Imitex/sumatriptan so hopefully no side effects w/ this  ^note: taking these TOGETHER is ok but may cause drowsiness  If the above not helping / still having weird headaches/head sensations, let's connect you with a neurologist to talk more.

## 2020-12-18 NOTE — Progress Notes (Signed)
Caitlyn Jackson is a 30 y.o. female who presents to  [facility] at [facility]  today, 12/18/20, seeking care for the following:  Marland Kitchen Migraines  o Recent new diagnosis.  See ER notes and last visit notes, see telephone notes in the interim as well.  Patient had some significant side effects with the Imitrex as well as with the Nurtec.  She reports persistent feeling of pressure/tingling in the scalp, she is under a great deal of anxiety regarding new diagnosis of migraine, fear of taking medications to help her to feel better given the side effects that she was having, significant anxiety that her anxiety itself might be causing these issues or at least exacerbating them.  Lack of sleep has been a huge problem for her, she feels that if she could just get a couple of good nights rest she might be back on track.     ASSESSMENT & PLAN with other pertinent findings:  The encounter diagnosis was Migraine with aura and without status migrainosus, not intractable.   Long discussion with patient about options for treatment, reassurance that no red flags for anything serious though given new onset and persistence of symptoms I would certainly have a low threshold for getting an MRI if she still continues to have headaches into next week or if anything changes or gets worse between now and then.  Patient Instructions  PLAN:   Anxiety/panic: ok to take Klonopin aka clonazepam as directed  Sleep: ok to take Elavil aka amitriptyline 10-50 mg (1-5 pills) around evening/bedtime to help with insomnia.   Migraine: try Ibuprofen 800 mg + something with caffeine + Benadryl 50 mg along with that. If that's not helping, use the Frova aka frovatriptan which is less potent than Imitex/sumatriptan so hopefully no side effects w/ this  ^note: taking these TOGETHER is ok but may cause drowsiness  If the above not helping / still having weird headaches/head sensations,  let's connect you with a neurologist to talk more.     No orders of the defined types were placed in this encounter.   Meds ordered this encounter  Medications  . amitriptyline (ELAVIL) 10 MG tablet    Sig: Take 1-5 tablets (10-50 mg total) by mouth at bedtime as needed for sleep.    Dispense:  30 tablet    Refill:  0  . frovatriptan (FROVA) 2.5 MG tablet    Sig: Take 1 tablet (2.5 mg total) by mouth as needed for migraine (repeat in 2 hours if migraine persists). If recurs, may repeat after 2 hours. Max of 3 tabs in 24 hours.    Dispense:  10 tablet    Refill:  0    IF PA REQUIRED, INTOLERANT TO IMITREX AND NURTEC     See below for relevant physical exam findings  See below for recent lab and imaging results reviewed  Medications, allergies, PMH, PSH, SocH, FamH reviewed below    Follow-up instructions: Return for VIRTUAL *OR* IN-OFFICE VISIT in 1-2 weeks - recheck headaches / medication .                                        Exam:  BP 105/67 (BP Location: Left Arm, Patient Position: Sitting, Cuff Size: Normal)   Pulse 88   Temp 98.4 F (36.9 C) (Oral)   Wt 185 lb 0.6 oz (83.9  kg)   BMI 32.78 kg/m   Constitutional: VS see above. General Appearance: alert, well-developed, well-nourished, NAD  Neck: No masses, trachea midline.   Respiratory: Normal respiratory effort.   Musculoskeletal: Gait normal. Symmetric and independent movement of all extremities  Neurological: Normal balance/coordination. No tremor.  Skin: warm, dry, intact.   Psychiatric: Normal judgment/insight. Anxious mood and affect. Oriented x3.   Current Meds  Medication Sig  . amitriptyline (ELAVIL) 10 MG tablet Take 1-5 tablets (10-50 mg total) by mouth at bedtime as needed for sleep.  . clonazePAM (KLONOPIN) 0.5 MG tablet Take 0.5-1 tablets (0.25-0.5 mg total) by mouth 3 (three) times daily as needed for anxiety.  . colesevelam (WELCHOL) 625 MG tablet Take  1 tablet (625 mg total) by mouth 2 (two) times daily with a meal.  . frovatriptan (FROVA) 2.5 MG tablet Take 1 tablet (2.5 mg total) by mouth as needed for migraine (repeat in 2 hours if migraine persists). If recurs, may repeat after 2 hours. Max of 3 tabs in 24 hours.    Allergies  Allergen Reactions  . Cefoxitin Hives  . Cefuroxime Axetil Hives, Other (See Comments) and Rash    Hives that turn to open sores      Patient Active Problem List   Diagnosis Date Noted  . Migraine with aura and without status migrainosus, not intractable 12/02/2020  . Genetic testing 05/20/2020  . Relies on partner vasectomy for contraception 07/20/2019  . Rib pain on left side 07/20/2019  . History of cesarean section complicating pregnancy 11/04/2015  . Family history of breast cancer in female 12/02/2014  . Family history of hypertension in mother 12/02/2014    Family History  Problem Relation Age of Onset  . High blood pressure Mother   . Breast cancer Maternal Aunt        dx late 63s  . Liver cancer Cousin        maternal cousin; dx 70s    Social History   Tobacco Use  Smoking Status Never Smoker  Smokeless Tobacco Never Used    Past Surgical History:  Procedure Laterality Date  . CESAREAN SECTION     2014/2017    Immunization History  Administered Date(s) Administered  . Influenza, Seasonal, Injecte, Preservative Fre 06/23/2015  . Influenza,inj,Quad PF,6+ Mos 07/20/2019  . Influenza-Unspecified 08/02/2020  . PFIZER(Purple Top)SARS-COV-2 Vaccination 02/05/2020, 03/06/2020  . Tdap 03/16/2013, 05/28/2013, 08/12/2015    No results found for this or any previous visit (from the past 2160 hour(s)).  No results found.     All questions at time of visit were answered - patient instructed to contact office with any additional concerns or updates. ER/RTC precautions were reviewed with the patient as applicable.   Please note: manual typing as well as voice recognition software  may have been used to produce this document - typos may escape review. Please contact Dr. Lyn Hollingshead for any needed clarifications.

## 2020-12-21 ENCOUNTER — Encounter: Payer: Self-pay | Admitting: Osteopathic Medicine

## 2020-12-22 ENCOUNTER — Telehealth (INDEPENDENT_AMBULATORY_CARE_PROVIDER_SITE_OTHER): Payer: BC Managed Care – PPO | Admitting: Osteopathic Medicine

## 2020-12-22 ENCOUNTER — Encounter: Payer: Self-pay | Admitting: Osteopathic Medicine

## 2020-12-22 DIAGNOSIS — G4700 Insomnia, unspecified: Secondary | ICD-10-CM

## 2020-12-22 DIAGNOSIS — G43109 Migraine with aura, not intractable, without status migrainosus: Secondary | ICD-10-CM | POA: Diagnosis not present

## 2020-12-22 DIAGNOSIS — F418 Other specified anxiety disorders: Secondary | ICD-10-CM

## 2020-12-22 DIAGNOSIS — K580 Irritable bowel syndrome with diarrhea: Secondary | ICD-10-CM

## 2020-12-22 MED ORDER — ONDANSETRON 8 MG PO TBDP: 8.0000 mg | ORAL_TABLET | Freq: Three times a day (TID) | ORAL | 3 refills | Status: AC | PRN

## 2020-12-22 MED ORDER — CLONAZEPAM 0.5 MG PO TABS: ORAL_TABLET | ORAL | 0 refills | Status: AC

## 2020-12-22 NOTE — Patient Instructions (Addendum)
Plan:  Klonopin on a schedule: Day 1-7: Klonopin three times per day every day - when you wake up, lunchtime, bedtime Day 8-12: Klonopin twice per day - breakfast OR lunch time, bedtime  Day 13-17: Klonopin at bedtime   Zofran aka ondansetron as needed for nausea. Can take this w/ the colesevelam or can hold the colesevelam for now   MRI referral is in Neurology referral is in Mental health referral is in You should get a call about these appointment soon

## 2020-12-22 NOTE — Progress Notes (Signed)
Attempted to contact patient at 110 pm. No answer, left a detailed vm msg. Direct call back info provided.

## 2020-12-22 NOTE — Progress Notes (Signed)
Telemedicine Visit via  Video & Audio (App used: MyChart)   I connected with Caitlyn Jackson on 12/22/20 at 2:18 PM  by phone or  telemedicine application as noted above  I verified that I am speaking with or regarding  the correct patient using two identifiers.  Participants: Myself, Dr Sunnie Nielsen DO Patient: Caitlyn Jackson Patient proxy if applicable: none Other, if applicable: none  Patient is at home I am in office at [facility]    I discussed the limitations of evaluation and management  by telemedicine and the availability of in person appointments.  The participant(s) above expressed understanding and  agreed to proceed with this appointment via telemedicine.       History of Present Illness: Caitlyn Jackson is a 30 y.o. female who would like to discuss migraines  Thursday we had out last appointment Was anxious so took half 73 (old Rx, was expired) This helped, felt "like I could handle life a little better" not so distracted by somatic symptoms  Next day took the amitriptyline in evening to help sleep --> roof of mouth went numb, arms felt heavy, R side of face started tingling, then felt throat itching. This within an hour or so of taking the Rx  Next night took one benadryl and felt chest pain in middle of night In church on Sunday had panic attack, had to leave the chapelm was unable Last night (+)N/V, shaking This AM, had 2 pieces plan bread, banana, took ibuprofen 400 mg at 8:30 and threw up again about 2 hours later          Observations/Objective: There were no vitals taken for this visit. BP Readings from Last 3 Encounters:  12/18/20 105/67  04/04/20 121/82  12/11/19 118/78   Exam: Normal Speech.  NAD, anxious  Lab and Radiology Results No results found for this or any previous visit (from the past 72 hour(s)). No results found.     Assessment and Plan: 30 y.o. female with The primary encounter diagnosis was  Migraine with aura and without status migrainosus, not intractable. 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  Significant anxiety re: recent Dx migraines + significant side effects from treatments is causing a feedback loop of anxiety and somatic symptoms that is very detrimental to patient's quality of life and is complicating treatment. I think there may be some underlying generalized anxiety disorder but this is not the cause of initial migraine, rather has "primed" Ms. Deguzman to have an anxiety reaction out of proportion to a situational event. I'd like to help as much as I can.   1) reassurance/reassessment: MRI and neuro referral to cover all our bases. I'd be very surprised if serious neurological pathology was present but would like to be as sure as we can be. Would like neurology to also discuss if Botox injections would be a good options since systemic treatment has caused adverse events (triptan, Nurtec, TCA)  2) treat anxiety: benzo taper to treat severe situational anxiety / stress reaction. Insomnia is also not helping matters. Severe anxiety is furthermore likely contributing to more IBS symptoms N/V/D.  3) mental health referral: therapy to help address coping mechanisms, cope with new diagnosis of illness, and I'd have a high threhsold for [employer] pharmaceuticals given patient's reactions to medications, I think counseling is a good  place to start   PDMP not reviewed this encounter. Orders Placed This Encounter  Procedures  . MR Brain W Wo Contrast    Order Specific Question:   If indicated for the ordered procedure, I authorize the administration of contrast media per Radiology protocol    Answer:   Yes    Order Specific Question:   What is the patient's sedation requirement?     Answer:   Anti-anxiety    Order Specific Question:   Does the patient have a pacemaker or implanted devices?    Answer:   No    Order Specific Question:   Preferred imaging location?    Answer:   Internal  . Ambulatory referral to Neurology    Referral Priority:   Urgent    Referral Type:   Consultation    Referral Reason:   Specialty Services Required    Requested Specialty:   Neurology    Number of Visits Requested:   1  . Ambulatory referral to Behavioral Health    Referral Priority:   Routine    Referral Type:   Psychiatric    Referral Reason:   Specialty Services Required    Requested Specialty:   Behavioral Health    Number of Visits Requested:   1   Meds ordered this encounter  Medications  . clonazePAM (KLONOPIN) 0.5 MG tablet    Sig: Take 0.5-1 tablets (0.25-0.5 mg total) by mouth in the morning, at noon, and at bedtime for 7 days, THEN 0.5-1 tablets (0.25-0.5 mg total) 2 (two) times daily for 5 days, THEN 0.5-1 tablets (0.25-0.5 mg total) at bedtime for 5 days.    Dispense:  36 tablet    Refill:  0    CANCEL RX FOR KLONOPIN WRITTEN 10/2020  . ondansetron (ZOFRAN-ODT) 8 MG disintegrating tablet    Sig: Take 1 tablet (8 mg total) by mouth every 8 (eight) hours as needed for nausea.    Dispense:  20 tablet    Refill:  3   Patient Instructions  Plan:  Klonopin on a schedule: Day 1-7: Klonopin three times per day every day - when you wake up, lunchtime, bedtime Day 8-12: Klonopin twice per day - breakfast OR lunch time, bedtime  Day 13-17: Klonopin at bedtime   Zofran aka ondansetron as needed for nausea. Can take this w/ the colesevelam or can hold the colesevelam for now   MRI referral is in Neurology referral is in Mental health referral is in You should get a call about these appointment soon        Instructions sent via MyChart.   Follow Up Instructions: Return in about 9 days (around 12/31/2020) for VIRTUAL VISIT.    I discussed the assessment and  treatment plan with the patient. The patient was provided an opportunity to ask questions and all were answered. The patient agreed with the plan and demonstrated an understanding of the instructions.   The patient was advised to call back or seek an in-person evaluation if any new concerns, if symptoms worsen or if the condition fails to improve as anticipated.  30 minutes of non-face-to-face time was provided during this encounter.      . . . . . . . . . . . . . Marland Kitchen                   Historical information moved to improve visibility of documentation.  Past Medical History:  Diagnosis Date  . Rib pain on  left side 07/20/2019   Past Surgical History:  Procedure Laterality Date  . CESAREAN SECTION     2014/2017   Social History   Tobacco Use  . Smoking status: Never Smoker  . Smokeless tobacco: Never Used  Substance Use Topics  . Alcohol use: Never   family history includes Breast cancer in her maternal aunt; High blood pressure in her mother; Liver cancer in her cousin.  Medications: Current Outpatient Medications  Medication Sig Dispense Refill  . colesevelam (WELCHOL) 625 MG tablet Take 1 tablet (625 mg total) by mouth 2 (two) times daily with a meal. 180 tablet 3  . frovatriptan (FROVA) 2.5 MG tablet Take 1 tablet (2.5 mg total) by mouth as needed for migraine (repeat in 2 hours if migraine persists). If recurs, may repeat after 2 hours. Max of 3 tabs in 24 hours. 10 tablet 0  . ondansetron (ZOFRAN-ODT) 8 MG disintegrating tablet Take 1 tablet (8 mg total) by mouth every 8 (eight) hours as needed for nausea. 20 tablet 3  . clonazePAM (KLONOPIN) 0.5 MG tablet Take 0.5-1 tablets (0.25-0.5 mg total) by mouth in the morning, at noon, and at bedtime for 7 days, THEN 0.5-1 tablets (0.25-0.5 mg total) 2 (two) times daily for 5 days, THEN 0.5-1 tablets (0.25-0.5 mg total) at bedtime for 5 days. 36 tablet 0   No current facility-administered medications  for this visit.   Allergies  Allergen Reactions  . Cefoxitin Hives  . Cefuroxime Axetil Hives, Other (See Comments) and Rash    Hives that turn to open sores       If phone visit, billing and coding can please add appropriate modifier if needed

## 2020-12-23 ENCOUNTER — Telehealth: Payer: Self-pay

## 2020-12-23 DIAGNOSIS — M9901 Segmental and somatic dysfunction of cervical region: Secondary | ICD-10-CM | POA: Diagnosis not present

## 2020-12-23 DIAGNOSIS — M47811 Spondylosis without myelopathy or radiculopathy, occipito-atlanto-axial region: Secondary | ICD-10-CM | POA: Diagnosis not present

## 2020-12-23 DIAGNOSIS — M9902 Segmental and somatic dysfunction of thoracic region: Secondary | ICD-10-CM | POA: Diagnosis not present

## 2020-12-23 DIAGNOSIS — M5481 Occipital neuralgia: Secondary | ICD-10-CM | POA: Diagnosis not present

## 2020-12-23 NOTE — Telephone Encounter (Signed)
Prior authorization for Frovatriptan submitted to insurance. Pending a determination.

## 2020-12-24 ENCOUNTER — Telehealth: Payer: Self-pay | Admitting: Osteopathic Medicine

## 2020-12-24 NOTE — Telephone Encounter (Signed)
Patient has dropped off FMLA paperwork for the provider to fill out, stated that Jeri Lager was aware of this and patient is aware that PCP will be out of the office until next week. Billing form attached to paperwork and placed in provider box. ANM *12/24/2020*

## 2020-12-25 ENCOUNTER — Encounter: Payer: Self-pay | Admitting: *Deleted

## 2020-12-25 ENCOUNTER — Telehealth: Payer: Self-pay

## 2020-12-25 ENCOUNTER — Telehealth: Payer: Self-pay | Admitting: Neurology

## 2020-12-25 ENCOUNTER — Ambulatory Visit: Payer: BC Managed Care – PPO | Admitting: Neurology

## 2020-12-25 NOTE — Telephone Encounter (Signed)
New pt appointment cancelled due to MD out sick.

## 2020-12-25 NOTE — Telephone Encounter (Signed)
Noted. Pt has been contacted and updated of paperwork procedures. Pt has been informed that the clinic will contact her once FMLA form is completed by provider.

## 2020-12-25 NOTE — Telephone Encounter (Signed)
Task completed. Pt has been updated and provided with specialist direct contact info. Pt informed to contact the office if she does not hear from specialist office for appt scheduling.

## 2020-12-25 NOTE — Telephone Encounter (Signed)
Caitlyn Jackson -   Pt called stating that her Neurology appt has been cancelled due to the provider being out sick. Pt was informed that next available appt is at the end of May. Requesting to get another appt/different location. Thanks in advance.

## 2020-12-26 DIAGNOSIS — M47811 Spondylosis without myelopathy or radiculopathy, occipito-atlanto-axial region: Secondary | ICD-10-CM | POA: Diagnosis not present

## 2020-12-26 DIAGNOSIS — M9901 Segmental and somatic dysfunction of cervical region: Secondary | ICD-10-CM | POA: Diagnosis not present

## 2020-12-26 DIAGNOSIS — M5481 Occipital neuralgia: Secondary | ICD-10-CM | POA: Diagnosis not present

## 2020-12-26 DIAGNOSIS — M9902 Segmental and somatic dysfunction of thoracic region: Secondary | ICD-10-CM | POA: Diagnosis not present

## 2020-12-29 ENCOUNTER — Telehealth: Payer: Self-pay

## 2020-12-29 DIAGNOSIS — M5412 Radiculopathy, cervical region: Secondary | ICD-10-CM | POA: Diagnosis not present

## 2020-12-29 DIAGNOSIS — M9901 Segmental and somatic dysfunction of cervical region: Secondary | ICD-10-CM | POA: Diagnosis not present

## 2020-12-29 DIAGNOSIS — M546 Pain in thoracic spine: Secondary | ICD-10-CM | POA: Diagnosis not present

## 2020-12-29 DIAGNOSIS — M9902 Segmental and somatic dysfunction of thoracic region: Secondary | ICD-10-CM | POA: Diagnosis not present

## 2020-12-29 NOTE — Telephone Encounter (Signed)
Yes Colesevelam for sure. How long has she been on this medication?

## 2020-12-29 NOTE — Telephone Encounter (Signed)
Routing to covering provider -   Pt called stating that she has been having very little bowel movements. She is having severe constipation, which is causing her pain and bleeding during her bowel movement. Pt is currently taking Colesevelam for IBS-D, klonopin and ibuprofen as needed. She wants to know whether any of these meds may be causing her the constipation. Pls advise, thanks.

## 2020-12-29 NOTE — Telephone Encounter (Addendum)
Pt started the medication one week ago. Should she continue with the rx or stop?

## 2020-12-30 NOTE — Telephone Encounter (Signed)
Stop and then if diarrhea starts back start once a day with a meal and see how that works.

## 2020-12-31 ENCOUNTER — Telehealth: Payer: Self-pay

## 2020-12-31 DIAGNOSIS — M9901 Segmental and somatic dysfunction of cervical region: Secondary | ICD-10-CM | POA: Diagnosis not present

## 2020-12-31 DIAGNOSIS — M9902 Segmental and somatic dysfunction of thoracic region: Secondary | ICD-10-CM | POA: Diagnosis not present

## 2020-12-31 DIAGNOSIS — M5412 Radiculopathy, cervical region: Secondary | ICD-10-CM | POA: Diagnosis not present

## 2020-12-31 DIAGNOSIS — M546 Pain in thoracic spine: Secondary | ICD-10-CM | POA: Diagnosis not present

## 2020-12-31 NOTE — Telephone Encounter (Signed)
Pt called yesterday requesting provider's recommendation. Pt is experiencing severe constipation and painful bowel movements with colesevelam. Per pt, she is taking it once a day with a meal. Yesterday she didn't take the rx and she was able to have a bowel movement. Pt wants to know if she can take docusate? Or is there another alternative rx for IBS-D? Pls advise, thanks.

## 2020-12-31 NOTE — Telephone Encounter (Signed)
Task completed. Pt has been updated of provider's recommendation. Per pt, she is taking colesevelam once daily with food. She didn't take the rx yesterday and was able to have a bowel movement.

## 2021-01-01 NOTE — Telephone Encounter (Signed)
OK to stop the colesevelam / use as needed

## 2021-01-02 DIAGNOSIS — M9901 Segmental and somatic dysfunction of cervical region: Secondary | ICD-10-CM | POA: Diagnosis not present

## 2021-01-02 DIAGNOSIS — M546 Pain in thoracic spine: Secondary | ICD-10-CM | POA: Diagnosis not present

## 2021-01-02 DIAGNOSIS — M5412 Radiculopathy, cervical region: Secondary | ICD-10-CM | POA: Diagnosis not present

## 2021-01-02 DIAGNOSIS — M9902 Segmental and somatic dysfunction of thoracic region: Secondary | ICD-10-CM | POA: Diagnosis not present

## 2021-01-02 NOTE — Telephone Encounter (Signed)
Task completed. Left a detailed vm msg for pt regarding provider's recommendation. Direct call back info provided.

## 2021-01-06 DIAGNOSIS — H6502 Acute serous otitis media, left ear: Secondary | ICD-10-CM | POA: Diagnosis not present

## 2021-01-06 DIAGNOSIS — J00 Acute nasopharyngitis [common cold]: Secondary | ICD-10-CM | POA: Diagnosis not present

## 2021-01-08 ENCOUNTER — Encounter (INDEPENDENT_AMBULATORY_CARE_PROVIDER_SITE_OTHER): Payer: Self-pay | Admitting: Osteopathic Medicine

## 2021-01-08 DIAGNOSIS — G43109 Migraine with aura, not intractable, without status migrainosus: Secondary | ICD-10-CM

## 2021-01-09 DIAGNOSIS — M9901 Segmental and somatic dysfunction of cervical region: Secondary | ICD-10-CM | POA: Diagnosis not present

## 2021-01-09 DIAGNOSIS — M546 Pain in thoracic spine: Secondary | ICD-10-CM | POA: Diagnosis not present

## 2021-01-09 DIAGNOSIS — M5412 Radiculopathy, cervical region: Secondary | ICD-10-CM | POA: Diagnosis not present

## 2021-01-09 DIAGNOSIS — M9902 Segmental and somatic dysfunction of thoracic region: Secondary | ICD-10-CM | POA: Diagnosis not present

## 2021-01-14 DIAGNOSIS — M546 Pain in thoracic spine: Secondary | ICD-10-CM | POA: Diagnosis not present

## 2021-01-14 DIAGNOSIS — M9902 Segmental and somatic dysfunction of thoracic region: Secondary | ICD-10-CM | POA: Diagnosis not present

## 2021-01-14 DIAGNOSIS — G43109 Migraine with aura, not intractable, without status migrainosus: Secondary | ICD-10-CM | POA: Diagnosis not present

## 2021-01-14 DIAGNOSIS — K580 Irritable bowel syndrome with diarrhea: Secondary | ICD-10-CM | POA: Diagnosis not present

## 2021-01-14 DIAGNOSIS — E785 Hyperlipidemia, unspecified: Secondary | ICD-10-CM | POA: Diagnosis not present

## 2021-01-14 DIAGNOSIS — F411 Generalized anxiety disorder: Secondary | ICD-10-CM | POA: Diagnosis not present

## 2021-01-14 DIAGNOSIS — M9901 Segmental and somatic dysfunction of cervical region: Secondary | ICD-10-CM | POA: Diagnosis not present

## 2021-01-14 DIAGNOSIS — M5412 Radiculopathy, cervical region: Secondary | ICD-10-CM | POA: Diagnosis not present

## 2021-01-15 NOTE — Telephone Encounter (Signed)
Spoke with pt about her FMLA paperwork. She stated that she has been calling for days and no one has returned her call. She stated that the time limit for her paperwork was running out and no one has gotten back to her. Her phone number is [phone]. I do see in the notes that Dr. Lyn Hollingshead asked her some questions and it looks as if she may have answered one. Please advise. tvt

## 2021-01-16 NOTE — Telephone Encounter (Signed)
Forms completed Placed in Caitlyn Jackson's inbasket - please copy to scan and ok to send/fax Please call pt when faxed  Thanks!

## 2021-01-19 DIAGNOSIS — M9901 Segmental and somatic dysfunction of cervical region: Secondary | ICD-10-CM | POA: Diagnosis not present

## 2021-01-19 DIAGNOSIS — M9902 Segmental and somatic dysfunction of thoracic region: Secondary | ICD-10-CM | POA: Diagnosis not present

## 2021-01-19 DIAGNOSIS — M5412 Radiculopathy, cervical region: Secondary | ICD-10-CM | POA: Diagnosis not present

## 2021-01-19 DIAGNOSIS — M546 Pain in thoracic spine: Secondary | ICD-10-CM | POA: Diagnosis not present

## 2021-01-23 NOTE — Telephone Encounter (Signed)
Prior authorization for Nurtec denied by insurance.

## 2021-01-23 NOTE — Telephone Encounter (Signed)
Prior authorization for Frovatriptan approved by insurance. Per insurance: [id number];Review Type:Qty;Coverage Start Date:11/23/2020;Coverage End Date:12/23/2021;

## 2021-01-26 ENCOUNTER — Ambulatory Visit: Payer: Self-pay | Admitting: Psychology

## 2021-01-26 ENCOUNTER — Ambulatory Visit (INDEPENDENT_AMBULATORY_CARE_PROVIDER_SITE_OTHER): Payer: BC Managed Care – PPO | Admitting: Psychology

## 2021-01-26 DIAGNOSIS — F411 Generalized anxiety disorder: Secondary | ICD-10-CM | POA: Diagnosis not present

## 2021-01-26 DIAGNOSIS — M546 Pain in thoracic spine: Secondary | ICD-10-CM | POA: Diagnosis not present

## 2021-01-26 DIAGNOSIS — M9902 Segmental and somatic dysfunction of thoracic region: Secondary | ICD-10-CM | POA: Diagnosis not present

## 2021-01-26 DIAGNOSIS — M5412 Radiculopathy, cervical region: Secondary | ICD-10-CM | POA: Diagnosis not present

## 2021-01-26 DIAGNOSIS — M9901 Segmental and somatic dysfunction of cervical region: Secondary | ICD-10-CM | POA: Diagnosis not present

## 2021-01-27 DIAGNOSIS — R42 Dizziness and giddiness: Secondary | ICD-10-CM | POA: Diagnosis not present

## 2021-01-27 DIAGNOSIS — H539 Unspecified visual disturbance: Secondary | ICD-10-CM | POA: Diagnosis not present

## 2021-01-27 DIAGNOSIS — G4452 New daily persistent headache (NDPH): Secondary | ICD-10-CM | POA: Diagnosis not present

## 2021-01-27 DIAGNOSIS — R112 Nausea with vomiting, unspecified: Secondary | ICD-10-CM | POA: Diagnosis not present

## 2021-01-27 NOTE — Telephone Encounter (Signed)
Task completed on 01/16/21. Copy of form placed in scan folder. Pt was informed the original copy was available for pick up at the clinic's front desk.

## 2021-01-30 DIAGNOSIS — G43909 Migraine, unspecified, not intractable, without status migrainosus: Secondary | ICD-10-CM | POA: Diagnosis not present

## 2021-01-30 DIAGNOSIS — H9313 Tinnitus, bilateral: Secondary | ICD-10-CM | POA: Diagnosis not present

## 2021-01-30 DIAGNOSIS — H9203 Otalgia, bilateral: Secondary | ICD-10-CM | POA: Diagnosis not present

## 2021-01-30 DIAGNOSIS — H93293 Other abnormal auditory perceptions, bilateral: Secondary | ICD-10-CM | POA: Diagnosis not present

## 2021-02-02 DIAGNOSIS — M9901 Segmental and somatic dysfunction of cervical region: Secondary | ICD-10-CM | POA: Diagnosis not present

## 2021-02-02 DIAGNOSIS — M546 Pain in thoracic spine: Secondary | ICD-10-CM | POA: Diagnosis not present

## 2021-02-02 DIAGNOSIS — M5412 Radiculopathy, cervical region: Secondary | ICD-10-CM | POA: Diagnosis not present

## 2021-02-02 DIAGNOSIS — M9902 Segmental and somatic dysfunction of thoracic region: Secondary | ICD-10-CM | POA: Diagnosis not present

## 2021-02-03 ENCOUNTER — Ambulatory Visit (INDEPENDENT_AMBULATORY_CARE_PROVIDER_SITE_OTHER): Payer: BC Managed Care – PPO | Admitting: Psychology

## 2021-02-03 DIAGNOSIS — F411 Generalized anxiety disorder: Secondary | ICD-10-CM | POA: Diagnosis not present

## 2021-02-04 DIAGNOSIS — F410 Panic disorder [episodic paroxysmal anxiety] without agoraphobia: Secondary | ICD-10-CM | POA: Diagnosis not present

## 2021-02-04 DIAGNOSIS — F32A Depression, unspecified: Secondary | ICD-10-CM | POA: Diagnosis not present

## 2021-02-04 DIAGNOSIS — F411 Generalized anxiety disorder: Secondary | ICD-10-CM | POA: Diagnosis not present

## 2021-02-04 DIAGNOSIS — G43109 Migraine with aura, not intractable, without status migrainosus: Secondary | ICD-10-CM | POA: Diagnosis not present

## 2021-02-05 DIAGNOSIS — R748 Abnormal levels of other serum enzymes: Secondary | ICD-10-CM | POA: Diagnosis not present

## 2021-02-05 DIAGNOSIS — K591 Functional diarrhea: Secondary | ICD-10-CM | POA: Diagnosis not present

## 2021-02-05 DIAGNOSIS — R1084 Generalized abdominal pain: Secondary | ICD-10-CM | POA: Diagnosis not present

## 2021-02-06 DIAGNOSIS — G43909 Migraine, unspecified, not intractable, without status migrainosus: Secondary | ICD-10-CM | POA: Diagnosis not present

## 2021-02-10 ENCOUNTER — Ambulatory Visit: Payer: BC Managed Care – PPO | Admitting: Neurology

## 2021-02-10 DIAGNOSIS — M9901 Segmental and somatic dysfunction of cervical region: Secondary | ICD-10-CM | POA: Diagnosis not present

## 2021-02-10 DIAGNOSIS — M5412 Radiculopathy, cervical region: Secondary | ICD-10-CM | POA: Diagnosis not present

## 2021-02-10 DIAGNOSIS — M546 Pain in thoracic spine: Secondary | ICD-10-CM | POA: Diagnosis not present

## 2021-02-10 DIAGNOSIS — M9902 Segmental and somatic dysfunction of thoracic region: Secondary | ICD-10-CM | POA: Diagnosis not present

## 2021-02-11 DIAGNOSIS — G4709 Other insomnia: Secondary | ICD-10-CM | POA: Diagnosis not present

## 2021-02-11 DIAGNOSIS — F419 Anxiety disorder, unspecified: Secondary | ICD-10-CM | POA: Diagnosis not present

## 2021-02-12 DIAGNOSIS — F411 Generalized anxiety disorder: Secondary | ICD-10-CM | POA: Diagnosis not present

## 2021-02-13 ENCOUNTER — Ambulatory Visit (INDEPENDENT_AMBULATORY_CARE_PROVIDER_SITE_OTHER): Payer: BC Managed Care – PPO | Admitting: Psychology

## 2021-02-13 DIAGNOSIS — F411 Generalized anxiety disorder: Secondary | ICD-10-CM

## 2021-02-16 DIAGNOSIS — M5412 Radiculopathy, cervical region: Secondary | ICD-10-CM | POA: Diagnosis not present

## 2021-02-16 DIAGNOSIS — M9902 Segmental and somatic dysfunction of thoracic region: Secondary | ICD-10-CM | POA: Diagnosis not present

## 2021-02-16 DIAGNOSIS — F419 Anxiety disorder, unspecified: Secondary | ICD-10-CM | POA: Insufficient documentation

## 2021-02-16 DIAGNOSIS — M9907 Segmental and somatic dysfunction of upper extremity: Secondary | ICD-10-CM | POA: Diagnosis not present

## 2021-02-16 DIAGNOSIS — G4709 Other insomnia: Secondary | ICD-10-CM | POA: Diagnosis not present

## 2021-02-16 DIAGNOSIS — M9901 Segmental and somatic dysfunction of cervical region: Secondary | ICD-10-CM | POA: Diagnosis not present

## 2021-02-20 DIAGNOSIS — F411 Generalized anxiety disorder: Secondary | ICD-10-CM | POA: Diagnosis not present

## 2021-02-20 DIAGNOSIS — G4700 Insomnia, unspecified: Secondary | ICD-10-CM | POA: Diagnosis not present

## 2021-02-20 DIAGNOSIS — F4310 Post-traumatic stress disorder, unspecified: Secondary | ICD-10-CM | POA: Diagnosis not present

## 2021-02-24 ENCOUNTER — Ambulatory Visit (INDEPENDENT_AMBULATORY_CARE_PROVIDER_SITE_OTHER): Payer: BC Managed Care – PPO | Admitting: Psychology

## 2021-02-24 DIAGNOSIS — F411 Generalized anxiety disorder: Secondary | ICD-10-CM

## 2021-02-27 DIAGNOSIS — M5412 Radiculopathy, cervical region: Secondary | ICD-10-CM | POA: Diagnosis not present

## 2021-02-27 DIAGNOSIS — M546 Pain in thoracic spine: Secondary | ICD-10-CM | POA: Diagnosis not present

## 2021-02-27 DIAGNOSIS — F4310 Post-traumatic stress disorder, unspecified: Secondary | ICD-10-CM | POA: Diagnosis not present

## 2021-02-27 DIAGNOSIS — M9901 Segmental and somatic dysfunction of cervical region: Secondary | ICD-10-CM | POA: Diagnosis not present

## 2021-02-27 DIAGNOSIS — G4700 Insomnia, unspecified: Secondary | ICD-10-CM | POA: Diagnosis not present

## 2021-02-27 DIAGNOSIS — F411 Generalized anxiety disorder: Secondary | ICD-10-CM | POA: Diagnosis not present

## 2021-02-27 DIAGNOSIS — M9902 Segmental and somatic dysfunction of thoracic region: Secondary | ICD-10-CM | POA: Diagnosis not present

## 2021-03-02 ENCOUNTER — Ambulatory Visit (INDEPENDENT_AMBULATORY_CARE_PROVIDER_SITE_OTHER): Payer: BC Managed Care – PPO | Admitting: Psychology

## 2021-03-02 DIAGNOSIS — M542 Cervicalgia: Secondary | ICD-10-CM | POA: Diagnosis not present

## 2021-03-02 DIAGNOSIS — F411 Generalized anxiety disorder: Secondary | ICD-10-CM | POA: Diagnosis not present

## 2021-03-02 DIAGNOSIS — F419 Anxiety disorder, unspecified: Secondary | ICD-10-CM | POA: Diagnosis not present

## 2021-03-07 DIAGNOSIS — M546 Pain in thoracic spine: Secondary | ICD-10-CM | POA: Diagnosis not present

## 2021-03-07 DIAGNOSIS — M9902 Segmental and somatic dysfunction of thoracic region: Secondary | ICD-10-CM | POA: Diagnosis not present

## 2021-03-07 DIAGNOSIS — M9901 Segmental and somatic dysfunction of cervical region: Secondary | ICD-10-CM | POA: Diagnosis not present

## 2021-03-07 DIAGNOSIS — M5412 Radiculopathy, cervical region: Secondary | ICD-10-CM | POA: Diagnosis not present

## 2021-03-10 DIAGNOSIS — M5412 Radiculopathy, cervical region: Secondary | ICD-10-CM | POA: Diagnosis not present

## 2021-03-10 DIAGNOSIS — M546 Pain in thoracic spine: Secondary | ICD-10-CM | POA: Diagnosis not present

## 2021-03-10 DIAGNOSIS — M9902 Segmental and somatic dysfunction of thoracic region: Secondary | ICD-10-CM | POA: Diagnosis not present

## 2021-03-10 DIAGNOSIS — M9901 Segmental and somatic dysfunction of cervical region: Secondary | ICD-10-CM | POA: Diagnosis not present

## 2021-03-13 DIAGNOSIS — F411 Generalized anxiety disorder: Secondary | ICD-10-CM | POA: Diagnosis not present

## 2021-03-13 DIAGNOSIS — G4700 Insomnia, unspecified: Secondary | ICD-10-CM | POA: Diagnosis not present

## 2021-03-13 DIAGNOSIS — F4310 Post-traumatic stress disorder, unspecified: Secondary | ICD-10-CM | POA: Diagnosis not present

## 2021-03-16 ENCOUNTER — Ambulatory Visit (INDEPENDENT_AMBULATORY_CARE_PROVIDER_SITE_OTHER): Payer: BC Managed Care – PPO | Admitting: Psychology

## 2021-03-16 DIAGNOSIS — F411 Generalized anxiety disorder: Secondary | ICD-10-CM | POA: Diagnosis not present

## 2021-03-17 DIAGNOSIS — M546 Pain in thoracic spine: Secondary | ICD-10-CM | POA: Diagnosis not present

## 2021-03-17 DIAGNOSIS — M5412 Radiculopathy, cervical region: Secondary | ICD-10-CM | POA: Diagnosis not present

## 2021-03-17 DIAGNOSIS — M9902 Segmental and somatic dysfunction of thoracic region: Secondary | ICD-10-CM | POA: Diagnosis not present

## 2021-03-17 DIAGNOSIS — M9901 Segmental and somatic dysfunction of cervical region: Secondary | ICD-10-CM | POA: Diagnosis not present

## 2021-03-23 DIAGNOSIS — M9901 Segmental and somatic dysfunction of cervical region: Secondary | ICD-10-CM | POA: Diagnosis not present

## 2021-03-23 DIAGNOSIS — M9902 Segmental and somatic dysfunction of thoracic region: Secondary | ICD-10-CM | POA: Diagnosis not present

## 2021-03-23 DIAGNOSIS — M546 Pain in thoracic spine: Secondary | ICD-10-CM | POA: Diagnosis not present

## 2021-03-23 DIAGNOSIS — M5412 Radiculopathy, cervical region: Secondary | ICD-10-CM | POA: Diagnosis not present

## 2021-03-25 ENCOUNTER — Ambulatory Visit (INDEPENDENT_AMBULATORY_CARE_PROVIDER_SITE_OTHER): Payer: BC Managed Care – PPO | Admitting: Psychology

## 2021-03-25 DIAGNOSIS — F411 Generalized anxiety disorder: Secondary | ICD-10-CM | POA: Diagnosis not present

## 2021-03-27 DIAGNOSIS — M9902 Segmental and somatic dysfunction of thoracic region: Secondary | ICD-10-CM | POA: Diagnosis not present

## 2021-03-27 DIAGNOSIS — M5412 Radiculopathy, cervical region: Secondary | ICD-10-CM | POA: Diagnosis not present

## 2021-03-27 DIAGNOSIS — M9901 Segmental and somatic dysfunction of cervical region: Secondary | ICD-10-CM | POA: Diagnosis not present

## 2021-03-27 DIAGNOSIS — M546 Pain in thoracic spine: Secondary | ICD-10-CM | POA: Diagnosis not present

## 2021-03-31 DIAGNOSIS — M9901 Segmental and somatic dysfunction of cervical region: Secondary | ICD-10-CM | POA: Diagnosis not present

## 2021-03-31 DIAGNOSIS — M5412 Radiculopathy, cervical region: Secondary | ICD-10-CM | POA: Diagnosis not present

## 2021-03-31 DIAGNOSIS — M546 Pain in thoracic spine: Secondary | ICD-10-CM | POA: Diagnosis not present

## 2021-03-31 DIAGNOSIS — M9902 Segmental and somatic dysfunction of thoracic region: Secondary | ICD-10-CM | POA: Diagnosis not present

## 2021-04-06 ENCOUNTER — Ambulatory Visit (INDEPENDENT_AMBULATORY_CARE_PROVIDER_SITE_OTHER): Payer: BC Managed Care – PPO | Admitting: Psychology

## 2021-04-06 DIAGNOSIS — F4310 Post-traumatic stress disorder, unspecified: Secondary | ICD-10-CM | POA: Diagnosis not present

## 2021-04-10 DIAGNOSIS — M5412 Radiculopathy, cervical region: Secondary | ICD-10-CM | POA: Diagnosis not present

## 2021-04-10 DIAGNOSIS — M546 Pain in thoracic spine: Secondary | ICD-10-CM | POA: Diagnosis not present

## 2021-04-10 DIAGNOSIS — M9901 Segmental and somatic dysfunction of cervical region: Secondary | ICD-10-CM | POA: Diagnosis not present

## 2021-04-10 DIAGNOSIS — M9902 Segmental and somatic dysfunction of thoracic region: Secondary | ICD-10-CM | POA: Diagnosis not present

## 2021-04-14 ENCOUNTER — Ambulatory Visit (INDEPENDENT_AMBULATORY_CARE_PROVIDER_SITE_OTHER): Payer: BC Managed Care – PPO | Admitting: Psychology

## 2021-04-14 DIAGNOSIS — F4310 Post-traumatic stress disorder, unspecified: Secondary | ICD-10-CM | POA: Diagnosis not present

## 2021-04-17 DIAGNOSIS — F4310 Post-traumatic stress disorder, unspecified: Secondary | ICD-10-CM | POA: Diagnosis not present

## 2021-04-17 DIAGNOSIS — F411 Generalized anxiety disorder: Secondary | ICD-10-CM | POA: Diagnosis not present

## 2021-04-17 DIAGNOSIS — G4700 Insomnia, unspecified: Secondary | ICD-10-CM | POA: Diagnosis not present

## 2021-04-20 DIAGNOSIS — M9901 Segmental and somatic dysfunction of cervical region: Secondary | ICD-10-CM | POA: Diagnosis not present

## 2021-04-20 DIAGNOSIS — M9902 Segmental and somatic dysfunction of thoracic region: Secondary | ICD-10-CM | POA: Diagnosis not present

## 2021-04-20 DIAGNOSIS — M5412 Radiculopathy, cervical region: Secondary | ICD-10-CM | POA: Diagnosis not present

## 2021-04-20 DIAGNOSIS — M546 Pain in thoracic spine: Secondary | ICD-10-CM | POA: Diagnosis not present

## 2021-04-24 DIAGNOSIS — M5412 Radiculopathy, cervical region: Secondary | ICD-10-CM | POA: Diagnosis not present

## 2021-04-24 DIAGNOSIS — M9902 Segmental and somatic dysfunction of thoracic region: Secondary | ICD-10-CM | POA: Diagnosis not present

## 2021-04-24 DIAGNOSIS — M9901 Segmental and somatic dysfunction of cervical region: Secondary | ICD-10-CM | POA: Diagnosis not present

## 2021-04-24 DIAGNOSIS — M546 Pain in thoracic spine: Secondary | ICD-10-CM | POA: Diagnosis not present

## 2021-04-28 ENCOUNTER — Ambulatory Visit (INDEPENDENT_AMBULATORY_CARE_PROVIDER_SITE_OTHER): Payer: BC Managed Care – PPO | Admitting: Psychology

## 2021-04-28 DIAGNOSIS — F4310 Post-traumatic stress disorder, unspecified: Secondary | ICD-10-CM

## 2021-05-01 DIAGNOSIS — M9902 Segmental and somatic dysfunction of thoracic region: Secondary | ICD-10-CM | POA: Diagnosis not present

## 2021-05-01 DIAGNOSIS — M546 Pain in thoracic spine: Secondary | ICD-10-CM | POA: Diagnosis not present

## 2021-05-01 DIAGNOSIS — M9901 Segmental and somatic dysfunction of cervical region: Secondary | ICD-10-CM | POA: Diagnosis not present

## 2021-05-01 DIAGNOSIS — M5412 Radiculopathy, cervical region: Secondary | ICD-10-CM | POA: Diagnosis not present

## 2021-05-05 ENCOUNTER — Ambulatory Visit (INDEPENDENT_AMBULATORY_CARE_PROVIDER_SITE_OTHER): Payer: BC Managed Care – PPO | Admitting: Psychology

## 2021-05-05 DIAGNOSIS — F4310 Post-traumatic stress disorder, unspecified: Secondary | ICD-10-CM

## 2021-05-12 ENCOUNTER — Ambulatory Visit (INDEPENDENT_AMBULATORY_CARE_PROVIDER_SITE_OTHER): Payer: BC Managed Care – PPO | Admitting: Psychology

## 2021-05-12 DIAGNOSIS — F4310 Post-traumatic stress disorder, unspecified: Secondary | ICD-10-CM | POA: Diagnosis not present

## 2021-05-22 ENCOUNTER — Ambulatory Visit (INDEPENDENT_AMBULATORY_CARE_PROVIDER_SITE_OTHER): Payer: BC Managed Care – PPO | Admitting: Psychology

## 2021-05-22 DIAGNOSIS — F4310 Post-traumatic stress disorder, unspecified: Secondary | ICD-10-CM

## 2021-05-27 ENCOUNTER — Ambulatory Visit (INDEPENDENT_AMBULATORY_CARE_PROVIDER_SITE_OTHER): Payer: BC Managed Care – PPO | Admitting: Psychology

## 2021-05-27 DIAGNOSIS — F4310 Post-traumatic stress disorder, unspecified: Secondary | ICD-10-CM | POA: Diagnosis not present

## 2021-06-01 ENCOUNTER — Ambulatory Visit (INDEPENDENT_AMBULATORY_CARE_PROVIDER_SITE_OTHER): Payer: BC Managed Care – PPO | Admitting: Psychology

## 2021-06-01 DIAGNOSIS — F4310 Post-traumatic stress disorder, unspecified: Secondary | ICD-10-CM | POA: Diagnosis not present

## 2021-06-03 DIAGNOSIS — F419 Anxiety disorder, unspecified: Secondary | ICD-10-CM | POA: Diagnosis not present

## 2021-06-03 DIAGNOSIS — R634 Abnormal weight loss: Secondary | ICD-10-CM | POA: Diagnosis not present

## 2021-06-03 DIAGNOSIS — R202 Paresthesia of skin: Secondary | ICD-10-CM | POA: Diagnosis not present

## 2021-06-03 DIAGNOSIS — G4709 Other insomnia: Secondary | ICD-10-CM | POA: Diagnosis not present

## 2021-06-09 ENCOUNTER — Ambulatory Visit (INDEPENDENT_AMBULATORY_CARE_PROVIDER_SITE_OTHER): Payer: BC Managed Care – PPO | Admitting: Psychology

## 2021-06-09 DIAGNOSIS — F4310 Post-traumatic stress disorder, unspecified: Secondary | ICD-10-CM | POA: Diagnosis not present

## 2021-06-12 ENCOUNTER — Other Ambulatory Visit: Payer: Self-pay | Admitting: *Deleted

## 2021-06-12 ENCOUNTER — Encounter: Payer: Self-pay | Admitting: Podiatry

## 2021-06-12 ENCOUNTER — Ambulatory Visit (INDEPENDENT_AMBULATORY_CARE_PROVIDER_SITE_OTHER): Payer: BC Managed Care – PPO | Admitting: Podiatry

## 2021-06-12 ENCOUNTER — Other Ambulatory Visit: Payer: Self-pay

## 2021-06-12 ENCOUNTER — Encounter: Payer: Self-pay | Admitting: *Deleted

## 2021-06-12 ENCOUNTER — Ambulatory Visit (INDEPENDENT_AMBULATORY_CARE_PROVIDER_SITE_OTHER): Payer: BC Managed Care – PPO

## 2021-06-12 DIAGNOSIS — M79672 Pain in left foot: Secondary | ICD-10-CM | POA: Diagnosis not present

## 2021-06-12 DIAGNOSIS — G5753 Tarsal tunnel syndrome, bilateral lower limbs: Secondary | ICD-10-CM

## 2021-06-12 DIAGNOSIS — M778 Other enthesopathies, not elsewhere classified: Secondary | ICD-10-CM

## 2021-06-12 DIAGNOSIS — M79671 Pain in right foot: Secondary | ICD-10-CM | POA: Diagnosis not present

## 2021-06-12 NOTE — Progress Notes (Signed)
  Subjective:  Patient ID: Caitlyn Jackson, female    DOB: 1990/11/16,   MRN: [id number]  Chief Complaint  Patient presents with   Foot Pain    The pain is in the arch areas and my feet really fall asleep fast and I do have oofo's shoes which I wear every day    30 y.o. female presents for bilateral foot pain and tingling states that this started several weeks ago out of the blue. States she has also just been diagnoses with carpal tunnel and having nerve test done in November. Relates she likes to walk 5 miles a day and states she hasn't been able to do that due to the feeling of her feet falling asleep and pain.  Denies any other pedal complaints. Denies n/v/f/c.   Past Medical History:  Diagnosis Date   Rib pain on left side 07/20/2019    Objective:  Physical Exam: Vascular: DP/PT pulses 2/4 bilateral. CFT <3 seconds. Normal hair growth on digits. No edema.  Skin. No lacerations or abrasions bilateral feet.  Musculoskeletal: MMT 5/5 bilateral lower extremities in DF, PF, Inversion and Eversion. Deceased ROM in DF of ankle joint. Tender to posterior medial malleolar area bilateral R>L. Positive tinel sign on the right.  Neurological: Sensation intact to light touch.   Assessment:   1. Tarsal tunnel syndrome of both lower extremities      Plan:  Patient was evaluated and treated and all questions answered. X-rays reviewed and discussed with patient. No acute fractures or dislocations noted. Bilateral os trigonum present.  Discussed tarsal tunnel syndrome and possible etiologies with patient.  Ankle brace dispensed.  Relates she will call if she would like to try the meloxicam.  Ordered EMG/NCV hopefully to coincide with tests for carpal tunnel.  Return following NCV study results.     Louann Sjogren, DPM

## 2021-06-12 NOTE — Addendum Note (Signed)
Addended by: Hadley Pen R on: 06/12/2021 03:39 PM   Modules accepted: Orders

## 2021-06-16 ENCOUNTER — Ambulatory Visit (INDEPENDENT_AMBULATORY_CARE_PROVIDER_SITE_OTHER): Payer: BC Managed Care – PPO | Admitting: Psychology

## 2021-06-16 DIAGNOSIS — F4310 Post-traumatic stress disorder, unspecified: Secondary | ICD-10-CM | POA: Diagnosis not present

## 2021-06-30 ENCOUNTER — Ambulatory Visit (INDEPENDENT_AMBULATORY_CARE_PROVIDER_SITE_OTHER): Payer: BC Managed Care – PPO | Admitting: Psychology

## 2021-06-30 DIAGNOSIS — F4310 Post-traumatic stress disorder, unspecified: Secondary | ICD-10-CM | POA: Diagnosis not present

## 2021-07-07 ENCOUNTER — Ambulatory Visit (INDEPENDENT_AMBULATORY_CARE_PROVIDER_SITE_OTHER): Payer: BC Managed Care – PPO | Admitting: Psychology

## 2021-07-07 DIAGNOSIS — F4310 Post-traumatic stress disorder, unspecified: Secondary | ICD-10-CM

## 2021-07-15 ENCOUNTER — Ambulatory Visit (INDEPENDENT_AMBULATORY_CARE_PROVIDER_SITE_OTHER): Payer: BC Managed Care – PPO | Admitting: Psychology

## 2021-07-15 DIAGNOSIS — F4310 Post-traumatic stress disorder, unspecified: Secondary | ICD-10-CM

## 2021-07-21 ENCOUNTER — Ambulatory Visit: Payer: BC Managed Care – PPO | Admitting: Psychology

## 2021-07-24 DIAGNOSIS — R200 Anesthesia of skin: Secondary | ICD-10-CM | POA: Diagnosis not present

## 2021-07-27 DIAGNOSIS — F4310 Post-traumatic stress disorder, unspecified: Secondary | ICD-10-CM | POA: Diagnosis not present

## 2021-07-27 DIAGNOSIS — G4700 Insomnia, unspecified: Secondary | ICD-10-CM | POA: Diagnosis not present

## 2021-07-27 DIAGNOSIS — F411 Generalized anxiety disorder: Secondary | ICD-10-CM | POA: Diagnosis not present

## 2021-08-04 ENCOUNTER — Ambulatory Visit (INDEPENDENT_AMBULATORY_CARE_PROVIDER_SITE_OTHER): Payer: BC Managed Care – PPO | Admitting: Psychology

## 2021-08-04 DIAGNOSIS — F4310 Post-traumatic stress disorder, unspecified: Secondary | ICD-10-CM | POA: Diagnosis not present

## 2021-08-14 ENCOUNTER — Ambulatory Visit (INDEPENDENT_AMBULATORY_CARE_PROVIDER_SITE_OTHER): Payer: BC Managed Care – PPO | Admitting: Psychology

## 2021-08-14 DIAGNOSIS — F4310 Post-traumatic stress disorder, unspecified: Secondary | ICD-10-CM | POA: Diagnosis not present

## 2021-08-14 NOTE — Progress Notes (Addendum)
[facility] Counselor/Therapist Progress Note  Patient ID: LATARSHA ZANI, MRN: [id number],    Date: 08/14/2021  Time Spent: 50 minutes  Treatment Type: Individual Therapy  Reported Symptoms: Anxiety  Mental Status Exam: Appearance:  Casual     Behavior: Appropriate  Motor: Normal  Speech/Language:  Normal Rate  Affect: Blunt  Mood: anxious  Thought process: normal  Thought content:   WNL  Sensory/Perceptual disturbances:   WNL  Orientation: oriented to person, place, time/date, and situation  Attention: Good  Concentration: Good  Memory: WNL  Fund of knowledge:  Good  Insight:   Good  Judgment:  Good  Impulse Control: Good   Risk Assessment: Danger to Self:  No Self-injurious Behavior: No Danger to Others: No Duty to Warn:no Physical Aggression / Violence:No  Access to Firearms a concern: No  Gang Involvement:No   Subjective: The patient attended the session via video visit.  The patient gave consent to use WebEx.  The patient was alone in her home and therapist was in the office.  The patient reports that she feels anxious today.  She talked about planning a vacation and the difficulties that she had trying to plan it.  The patient stated that she tried to schedule something in [place] and that did not work out, but she was able to schedule something with her minister and his wife to go to [place].  The patient states that she was doing a lot of what if thinking.  We talked about changing what if thinking into something more positive.  We also discussed behavior being purposeful and that she is likely doing it to try to think of scenarios so she can figure out how to handle them.  I recommended that she review her list of coping strategies that we have already talked about.  I encouraged her to make a list of the strategies for maintenance and also for crisis.  Interventions: Cognitive Behavioral Therapy  Diagnosis:PTSD (post-traumatic stress  disorder)  Plan: Plan: Patient is to use CBT, mindfulness and coping skills to help decrease symptoms associated with her PTSD.   Long-term goal:   Reduce overall level, frequency, and intensity of the feelings of depression and anxiety 7/10 to a 0-2/10 in severity for at least 3 consecutive months per patient report.  The patient will also learn new ways of dealing with anxiety related to PTSD symptoms.  Short-term goal:  Decrease "deflating, self-doubting" and catastrophizing thinking style Decrease anxiety producing self talk such as thinking of the worse possible life outcome Identify and validate feelings related to anxiety and use CBT to change thoughts from negative self talk to positive self talk Utilize coping skills as discussed in session   Progress:  progressing   This record has been created using [employer].  Chart creation errors have been sought, but may not always have been located and corrected. Such creation errors do not reflect on the standard of medical care.   Ayerim Berquist G Schneur Crowson, LCSW

## 2021-08-19 ENCOUNTER — Ambulatory Visit (INDEPENDENT_AMBULATORY_CARE_PROVIDER_SITE_OTHER): Payer: BC Managed Care – PPO | Admitting: Psychology

## 2021-08-19 DIAGNOSIS — F4310 Post-traumatic stress disorder, unspecified: Secondary | ICD-10-CM
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
## 2021-08-27 ENCOUNTER — Ambulatory Visit (INDEPENDENT_AMBULATORY_CARE_PROVIDER_SITE_OTHER): Payer: BC Managed Care – PPO | Admitting: Psychology

## 2021-08-27 DIAGNOSIS — F4310 Post-traumatic stress disorder, unspecified: Secondary | ICD-10-CM
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
## 2021-09-03 ENCOUNTER — Ambulatory Visit: Payer: BC Managed Care – PPO | Admitting: Psychology

## 2021-09-03 ENCOUNTER — Ambulatory Visit (INDEPENDENT_AMBULATORY_CARE_PROVIDER_SITE_OTHER): Payer: BC Managed Care – PPO | Admitting: Psychology

## 2021-09-03 DIAGNOSIS — F4310 Post-traumatic stress disorder, unspecified: Secondary | ICD-10-CM
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
## 2021-09-08 ENCOUNTER — Ambulatory Visit (INDEPENDENT_AMBULATORY_CARE_PROVIDER_SITE_OTHER): Payer: BC Managed Care – PPO | Admitting: Psychology

## 2021-09-08 DIAGNOSIS — F4310 Post-traumatic stress disorder, unspecified: Secondary | ICD-10-CM | POA: Diagnosis not present
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
## 2021-09-09 ENCOUNTER — Other Ambulatory Visit: Payer: Self-pay

## 2021-09-09 ENCOUNTER — Emergency Department (INDEPENDENT_AMBULATORY_CARE_PROVIDER_SITE_OTHER)
Admission: RE | Admit: 2021-09-09 | Discharge: 2021-09-09 | Disposition: A | Discharge status: Home / Self Care | Payer: BC Managed Care – PPO | Source: Ambulatory Visit

## 2021-09-09 ENCOUNTER — Emergency Department (INDEPENDENT_AMBULATORY_CARE_PROVIDER_SITE_OTHER): Payer: BC Managed Care – PPO

## 2021-09-09 VITALS — BP 108/76 | HR 82 | Temp 98.2°F | Resp 20 | Ht 63.0 in | Wt 158.0 lb

## 2021-09-09 DIAGNOSIS — R059 Cough, unspecified: Secondary | ICD-10-CM

## 2021-09-09 DIAGNOSIS — J309 Allergic rhinitis, unspecified: Secondary | ICD-10-CM

## 2021-09-09 DIAGNOSIS — J0100 Acute maxillary sinusitis, unspecified: Secondary | ICD-10-CM | POA: Diagnosis not present

## 2021-09-09 HISTORY — DX: Anxiety disorder, unspecified: F41.9

## 2021-09-09 MED ORDER — BENZONATATE 200 MG PO CAPS: 200.0000 mg | ORAL_CAPSULE | Freq: Three times a day (TID) | ORAL | 0 refills | Status: AC | PRN

## 2021-09-09 MED ORDER — PREDNISONE 20 MG PO TABS: ORAL_TABLET | ORAL | 0 refills | Status: AC

## 2021-09-09 MED ORDER — DOXYCYCLINE HYCLATE 100 MG PO CAPS: 100.0000 mg | ORAL_CAPSULE | Freq: Two times a day (BID) | ORAL | 0 refills | Status: AC

## 2021-09-09 MED ORDER — FEXOFENADINE HCL 180 MG PO TABS
180.0000 mg | ORAL_TABLET | Freq: Every day | ORAL | 0 refills | Status: AC
Start: 2021-09-09 — End: 2021-09-24

## 2021-09-09 NOTE — Discharge Instructions (Addendum)
Advised patient to take medication as directed with food to completion.  Advised patient to take Prednisone and Allegra with first dose of Doxycycline for the next 5 of 7 days.  Advised may use Allegra afterwards as needed for concurrent postnasal drip/drainage.  Advised patient may use Tessalon Perles daily or as needed for cough.  Encouraged patient to increase daily water intake while taking these medications.

## 2021-09-09 NOTE — ED Triage Notes (Signed)
Pt presents to Urgent Care with c/o cough and nasal congestion x approx 3 weeks. Reports feeling better for a few days, but then states her symptoms have worsened over the last 4 days and she is now having sob.

## 2021-09-09 NOTE — ED Provider Notes (Signed)
[facility]    CSN: [id number] Arrival date & time: 09/09/21  1101      History   Chief Complaint Chief Complaint  Patient presents with   11:00 APPT   Cough   Nasal Congestion    HPI Caitlyn Jackson is a 31 y.o. female.   HPI 31 year old female presents with cough and nasal congestion for 3 weeks.  Reports feeling better for 3 days then her symptoms have worsened over the last 4 days and now having shortness of breath.  Pulse ox at triage was 98%.  Past Medical History:  Diagnosis Date   Anxiety    Rib pain on left side 07/20/2019    Patient Active Problem List   Diagnosis Date Noted   Anxiety 02/16/2021   Other insomnia 02/11/2021   Migraine with aura and without status migrainosus, not intractable 12/02/2020   Genetic testing 05/20/2020   Relies on partner vasectomy for contraception 07/20/2019   Rib pain on left side 07/20/2019   History of cesarean section complicating pregnancy [id number]   Family history of breast cancer in female 12/02/2014   Family history of hypertension in mother 12/02/2014    Past Surgical History:  Procedure Laterality Date   CESAREAN SECTION     2014/2017    OB History   No obstetric history on file.      Home Medications    Prior to Admission medications   Medication Sig Start Date End Date Taking? Authorizing Provider  benzonatate (TESSALON) 200 MG capsule Take 1 capsule (200 mg total) by mouth 3 (three) times daily as needed for up to 7 days for cough. 09/09/21 09/16/21 Yes Eliezer Lofts, FNP  doxycycline (VIBRAMYCIN) 100 MG capsule Take 1 capsule (100 mg total) by mouth 2 (two) times daily for 7 days. 09/09/21 09/16/21 Yes Eliezer Lofts, FNP  fexofenadine [facility] ALLERGY) 180 MG tablet Take 1 tablet (180 mg total) by mouth daily for 15 days. 09/09/21 09/24/21 Yes Eliezer Lofts, FNP  predniSONE (DELTASONE) 20 MG tablet Take 3 tabs PO daily x 5 days. 09/09/21  Yes Eliezer Lofts, FNP  busPIRone (BUSPAR) 15 MG tablet  Take 15 mg by mouth daily. 05/30/21   [provider]  clonazePAM (KLONOPIN) 0.5 MG tablet Take 0.5-1 tablets (0.25-0.5 mg total) by mouth in the morning, at noon, and at bedtime for 7 days, THEN 0.5-1 tablets (0.25-0.5 mg total) 2 (two) times daily for 5 days, THEN 0.5-1 tablets (0.25-0.5 mg total) at bedtime for 5 days. 12/22/20 01/08/21  Emeterio Reeve, DO  cyclobenzaprine (FLEXERIL) 10 MG tablet Take 10 mg by mouth 3 (three) times daily. 03/02/21   [provider]  hydrOXYzine (ATARAX/VISTARIL) 10 MG tablet Take 10 mg by mouth 3 (three) times daily. 01/27/21   [provider]  metoprolol tartrate (LOPRESSOR) 25 MG tablet Take 12.5 mg by mouth daily. 04/19/21   [provider]  ondansetron (ZOFRAN-ODT) 8 MG disintegrating tablet Take 1 tablet (8 mg total) by mouth every 8 (eight) hours as needed for nausea. 12/22/20   Emeterio Reeve, DO  traZODone (DESYREL) 100 MG tablet Take 200 mg by mouth at bedtime. 03/19/21   [provider]    Family History Family History  Problem Relation Age of Onset   High blood pressure Mother    Diabetes Father    Breast cancer Maternal Aunt        dx late 74s   Liver cancer Cousin        maternal cousin; dx  59s    Social History Social History   Tobacco Use   Smoking status: Never   Smokeless tobacco: Never  Vaping Use   Vaping Use: Never used  Substance Use Topics   Alcohol use: Never   Drug use: Never     Allergies   Raspberry, Amitriptyline, Cefoxitin, and Cefuroxime axetil   Review of Systems Review of Systems  HENT:  Positive for congestion.   Respiratory:  Positive for cough and shortness of breath.     Physical Exam Triage Vital Signs ED Triage Vitals  Enc Vitals Group     BP 09/09/21 1138 108/76     Pulse Rate 09/09/21 1138 82     Resp 09/09/21 1138 20     Temp 09/09/21 1138 98.2 F (36.8 C)     Temp Source 09/09/21 1138 Oral     SpO2 09/09/21 1138 98 %     Weight 09/09/21 1133  158 lb (71.7 kg)     Height 09/09/21 1133 5\' 3"  (1.6 m)     Head Circumference --      Peak Flow --      Pain Score 09/09/21 1132 0     Pain Loc --      Pain Edu? --      Excl. in [place]? --    No data found.  Updated Vital Signs BP 108/76 (BP Location: Right Arm)    Pulse 82    Temp 98.2 F (36.8 C) (Oral)    Resp 20    Ht 5\' 3"  (1.6 m)    Wt 158 lb (71.7 kg)    LMP 08/15/2021    SpO2 98%    BMI 27.99 kg/m      Physical Exam Vitals and nursing note reviewed.  Constitutional:      General: She is not in acute distress.    Appearance: She is obese. She is not ill-appearing.  HENT:     Right Ear: Tympanic membrane and external ear normal.     Left Ear: Tympanic membrane and external ear normal.     Ears:     Comments: Moderate eustachian tube dysfunction noted bilaterally    Mouth/Throat:     Mouth: Mucous membranes are moist.     Pharynx: Oropharynx is clear.     Comments: Moderate amount of clear drainage of posterior oropharynx noted Eyes:     Extraocular Movements: Extraocular movements intact.     Conjunctiva/sclera: Conjunctivae normal.     Pupils: Pupils are equal, round, and reactive to light.  Cardiovascular:     Rate and Rhythm: Normal rate and regular rhythm.     Pulses: Normal pulses.     Heart sounds: Normal heart sounds.  Pulmonary:     Effort: Pulmonary effort is normal.     Breath sounds: Normal breath sounds. No stridor. No wheezing, rhonchi or rales.  Musculoskeletal:     Cervical back: Normal range of motion and neck supple.  Skin:    General: Skin is warm and dry.  Neurological:     General: No focal deficit present.     Mental Status: She is alert and oriented to person, place, and time.     UC Treatments / Results  Labs (all labs ordered are listed, but only abnormal results are displayed) Labs Reviewed - No data to display  EKG   Radiology DG Chest 2 View  Result Date: 09/09/2021 CLINICAL DATA:  Productive cough. EXAM: CHEST - 2 VIEW  COMPARISON:  None. FINDINGS: The heart size and mediastinal contours are within normal limits. Both lungs are clear. The visualized skeletal structures are unremarkable. IMPRESSION: No active cardiopulmonary disease. Electronically Signed   By: Marijo Conception M.D.   On: 09/09/2021 11:56    Procedures Procedures (including critical care time)  Medications Ordered in UC Medications - No data to display  Initial Impression / Assessment and Plan / UC Course  I have reviewed the triage vital signs and the nursing notes.  Pertinent labs & imaging results that were available during my care of the patient were reviewed by me and considered in my medical decision making (see chart for details).     MDM: 1.  Subacute maxillary sinusitis-Rx'd Doxycycline; 2.  Cough Rx'd Prednisone and Tessalon Perles; 3.  Allergic rhinitis-Rx'd Allegra. Advised patient to take medication as directed with food to completion.  Advised patient to take Prednisone and Allegra with first dose of Doxycycline for the next 5 of 7 days.  Advised may use Allegra afterwards as needed for concurrent postnasal drip/drainage.  Advised patient may use Tessalon Perles daily or as needed for cough.  Encouraged patient to increase daily water intake while taking these medications.  Patient discharged home, hemodynamically stable. Final Clinical Impressions(s) / UC Diagnoses   Final diagnoses:  Cough, unspecified type  Subacute maxillary sinusitis  Allergic rhinitis, unspecified seasonality, unspecified trigger     Discharge Instructions      Advised patient to take medication as directed with food to completion.  Advised patient to take Prednisone and Allegra with first dose of Doxycycline for the next 5 of 7 days.  Advised may use Allegra afterwards as needed for concurrent postnasal drip/drainage.  Advised patient may use Tessalon Perles daily or as needed for cough.  Encouraged patient to increase daily water intake while taking  these medications.     ED Prescriptions     Medication Sig Dispense Auth. Provider   doxycycline (VIBRAMYCIN) 100 MG capsule Take 1 capsule (100 mg total) by mouth 2 (two) times daily for 7 days. 14 capsule Eliezer Lofts, FNP   predniSONE (DELTASONE) 20 MG tablet Take 3 tabs PO daily x 5 days. 15 tablet Eliezer Lofts, FNP   fexofenadine [facility] ALLERGY) 180 MG tablet Take 1 tablet (180 mg total) by mouth daily for 15 days. 15 tablet Eliezer Lofts, FNP   benzonatate (TESSALON) 200 MG capsule Take 1 capsule (200 mg total) by mouth 3 (three) times daily as needed for up to 7 days for cough. 40 capsule Eliezer Lofts, FNP      PDMP not reviewed this encounter.   Eliezer Lofts, [place] 09/09/21 1308

## 2021-09-15 ENCOUNTER — Ambulatory Visit (INDEPENDENT_AMBULATORY_CARE_PROVIDER_SITE_OTHER): Payer: BC Managed Care – PPO | Admitting: Psychology

## 2021-09-15 DIAGNOSIS — F4310 Post-traumatic stress disorder, unspecified: Secondary | ICD-10-CM | POA: Diagnosis not present
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
## 2021-09-22 ENCOUNTER — Ambulatory Visit (INDEPENDENT_AMBULATORY_CARE_PROVIDER_SITE_OTHER): Payer: BC Managed Care – PPO | Admitting: Psychology

## 2021-09-22 DIAGNOSIS — F4310 Post-traumatic stress disorder, unspecified: Secondary | ICD-10-CM | POA: Diagnosis not present
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
## 2021-09-29 ENCOUNTER — Ambulatory Visit (INDEPENDENT_AMBULATORY_CARE_PROVIDER_SITE_OTHER): Payer: BC Managed Care – PPO | Admitting: Psychology

## 2021-09-29 DIAGNOSIS — F4310 Post-traumatic stress disorder, unspecified: Secondary | ICD-10-CM
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
## 2021-10-06 ENCOUNTER — Ambulatory Visit (INDEPENDENT_AMBULATORY_CARE_PROVIDER_SITE_OTHER): Payer: BC Managed Care – PPO | Admitting: Psychology

## 2021-10-06 DIAGNOSIS — F4310 Post-traumatic stress disorder, unspecified: Secondary | ICD-10-CM
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
## 2021-10-13 ENCOUNTER — Ambulatory Visit (INDEPENDENT_AMBULATORY_CARE_PROVIDER_SITE_OTHER): Payer: BC Managed Care – PPO | Admitting: Psychology

## 2021-10-13 DIAGNOSIS — F4310 Post-traumatic stress disorder, unspecified: Secondary | ICD-10-CM | POA: Diagnosis not present
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
## 2021-10-20 ENCOUNTER — Ambulatory Visit: Payer: BC Managed Care – PPO | Admitting: Psychology

## 2021-10-27 ENCOUNTER — Ambulatory Visit (INDEPENDENT_AMBULATORY_CARE_PROVIDER_SITE_OTHER): Payer: BC Managed Care – PPO | Admitting: Psychology

## 2021-10-27 DIAGNOSIS — F4310 Post-traumatic stress disorder, unspecified: Secondary | ICD-10-CM | POA: Diagnosis not present
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
## 2021-11-03 ENCOUNTER — Ambulatory Visit: Payer: BC Managed Care – PPO | Admitting: Psychology

## 2021-11-10 ENCOUNTER — Ambulatory Visit (INDEPENDENT_AMBULATORY_CARE_PROVIDER_SITE_OTHER): Payer: BC Managed Care – PPO | Admitting: Psychology

## 2021-11-10 DIAGNOSIS — F4310 Post-traumatic stress disorder, unspecified: Secondary | ICD-10-CM
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
## 2021-11-17 ENCOUNTER — Ambulatory Visit (INDEPENDENT_AMBULATORY_CARE_PROVIDER_SITE_OTHER): Payer: BC Managed Care – PPO | Admitting: Psychology

## 2021-11-17 DIAGNOSIS — F4310 Post-traumatic stress disorder, unspecified: Secondary | ICD-10-CM
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
## 2021-11-24 ENCOUNTER — Ambulatory Visit (INDEPENDENT_AMBULATORY_CARE_PROVIDER_SITE_OTHER): Payer: BC Managed Care – PPO | Admitting: Psychology

## 2021-11-24 DIAGNOSIS — F4310 Post-traumatic stress disorder, unspecified: Secondary | ICD-10-CM
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
## 2021-12-01 ENCOUNTER — Ambulatory Visit (INDEPENDENT_AMBULATORY_CARE_PROVIDER_SITE_OTHER): Payer: BC Managed Care – PPO | Admitting: Psychology

## 2021-12-01 DIAGNOSIS — F4310 Post-traumatic stress disorder, unspecified: Secondary | ICD-10-CM

## 2021-12-01 NOTE — Progress Notes (Signed)
Caitlyn Jackson Counselor/Therapist Progress Note ? ?Patient ID: Caitlyn Jackson, MRN: [id number],   ? ?Date: 12/01/2021 ? ?Time Spent: 45 minutes ? ?Treatment Type: Individual Therapy ? ?Reported Symptoms: anxiety ? ?Mental Status Exam: ?Appearance:  Casual     ?Behavior: Appropriate  ?Motor: Normal  ?Speech/Language:  Normal Rate  ?Affect: Appropriate  ?Mood: normal  ?Thought process: normal  ?Thought content:   WNL  ?Sensory/Perceptual disturbances:   WNL  ?Orientation: oriented to person, place, time/date, and situation  ?Attention: Good  ?Concentration: Good  ?Memory: WNL  ?Fund of knowledge:  Good  ?Insight:   Good  ?Judgment:  Good  ?Impulse Control: Good  ? ?Risk Assessment: ?Danger to Self:  No ?Self-injurious Behavior: No ?Danger to Others: No ?Duty to Warn:no ?Physical Aggression / Violence:No  ?Access to Firearms a concern: No  ?Gang Involvement:No  ? ?Subjective: The patient attended face-to-face individual therapy session via video visit today.  The patient gave verbal consent for the session to be on video on WebEx.  The patient was alone in her home and the therapist was in the office.  The patient presents as pleasant and cooperative.  The patient reports that she was feeling overwhelmed last evening.  She states that her daughter is sick and she has been taking care of her by herself.  She also reports that her mother came home and was trying to have a conversation with her late last evening.  We talked about how to deal with the feeling of overwhelmed.  We discussed being able to recognize when this feeling is starting and stalling making decisions or having conversations about significant things.  I gave the patient some ways to set limits and boundaries when she is feeling overwhelmed.  I explained that it is not an emergency unless someone is dying and that with any situation she can put off having conversations if it is necessary.  We talked about the importance of maintaining her  breathing and meditation.  So that she can remain calm.  We will continue to work with patient on managing her anxiety. ? ?Interventions: Cognitive Behavioral Therapy, Mindfulness Meditation, Insight-Oriented, Family Systems, and Interpersonal ? ?Diagnosis:PTSD (post-traumatic stress disorder) ? ?Plan: Treatment Plan ? ?Strengths/Abilities:  Intelligent, supportive husband and mother, motivated, insightful  ? ?Treatment Preferences:  Outpatient Individual therapy ? ?Statement of Needs:  "I need some help with my anxiety and PTSD." ? ?Symptoms:  Excessive and/or unrealistic worry that is difficult to control occurring more days than not for at least 6  ?months about a number of events or activities.: (Status: maintained). Has been  ?exposed to a traumatic event involving actual or perceived threat of death or serious injury.:  (Status: maintained). Hypervigilance (e.g., feeling constantly on edge,  ?experiencing concentration difficulties, having trouble falling or staying asleep, exhibiting a general  ?state of irritability).(Status: improved). Impairment in social, occupational,  ?or other areas of functioning.:  (Status: improved). ? ?Problems Addressed: PTSD and Anxiety ? ?Goals:  ?LTG:  1. Eliminate or reduce the negative impact trauma related symptoms have  ?on social, occupational, and family functioning.  70% ?2. Stabilize anxiety level while increasing ability to function on a daily  70% ?STG:  Learn and implement problem solving strategies and implement mindfulness and meditation and exercise  60 % ?Target Date:04/07/2022 ?Frequency:  Weekly ?Modality:Individual ?Interventions by Therapist:  CBT, Problem solving, EMDR ? ?Patient progressing and approved this treatment plan. ? ?Lucan Riner G Falicia Lizotte, LCSW ? ? ? ? ? ? ? ? ? ? ? ? ? ? ? ? ? ?  Kateria Cutrona G Taisley Mordan, LCSW ? ? ? ? ? ? ? ? ? ? ? ? ? ? ? ? ? ? ? ? ? ? ? ? ? ? ? ? ? ?Dottie Vaquerano G Trevar Boehringer, LCSW ? ? ? ? ? ? ? ? ? ? ? ? ? ? ?Vershawn Westrup G Dima Ferrufino,  LCSW ? ? ? ? ? ? ? ? ? ? ? ? ? ? ?Hermela Hardt G Dabid Godown, LCSW ? ? ? ? ? ? ? ? ? ? ? ? ? ? ?Ashyia Schraeder G Jaylyn Iyer, LCSW ? ? ? ? ? ? ? ? ? ? ? ? ? ? ?Demarri Elie G Leslee Suire, LCSW ? ? ? ? ? ? ? ? ? ? ? ? ? ? ?Florentine Diekman G Agueda Houpt, LCSW ? ? ? ? ? ? ? ? ? ? ? ? ? ? ?Kaylie Ritter G Thoma Paulsen, LCSW ?

## 2021-12-08 ENCOUNTER — Ambulatory Visit (INDEPENDENT_AMBULATORY_CARE_PROVIDER_SITE_OTHER): Payer: BC Managed Care – PPO | Admitting: Psychology

## 2021-12-08 DIAGNOSIS — F4310 Post-traumatic stress disorder, unspecified: Secondary | ICD-10-CM

## 2021-12-08 NOTE — Progress Notes (Signed)
[facility] Counselor/Therapist Progress Note ? ?Patient ID: ALYVIA DERK, MRN: [id number],   ? ?Date: 12/08/2021 ? ?Time Spent: 55 minutes ? ?Treatment Type: Individual Therapy ? ?Reported Symptoms: anxiety ? ?Mental Status Exam: ?Appearance:  Casual     ?Behavior: Appropriate  ?Motor: Normal  ?Speech/Language:  Normal Rate  ?Affect: Appropriate  ?Mood: normal  ?Thought process: normal  ?Thought content:   WNL  ?Sensory/Perceptual disturbances:   WNL  ?Orientation: oriented to person, place, time/date, and situation  ?Attention: Good  ?Concentration: Good  ?Memory: WNL  ?Fund of knowledge:  Good  ?Insight:   Good  ?Judgment:  Good  ?Impulse Control: Good  ? ?Risk Assessment: ?Danger to Self:  No ?Self-injurious Behavior: No ?Danger to Others: No ?Duty to Warn:no ?Physical Aggression / Violence:No  ?Access to Firearms a concern: No  ?Gang Involvement:No  ? ?Subjective: The patient attended face-to-face individual therapy session via video visit today.  The patient gave verbal consent for the session to be on video on WebEx.  The patient was alone in her home and the therapist was in the office.  The patient presents as pleasant and cooperative.  The patient reports that she has been doing well since our last visit however, she has been a little overwhelmed with the workload.  She talked about feeling like she wants to do something else and we discussed the importance of her role as being a mother.  The patient was feeling a little like she wanted to get a different job or do something different and realizes that the job she has is very good about allowing her to do the things she needs to do for the children.  We talked about doing some reframing in her mind to help her change the way she sees taking care of her children.  In addition we talked about some parenting skills and what she can expect from the teenage years.  The patient is doing a good job of managing her anxiety. ? ?Interventions:  Cognitive Behavioral Therapy, Mindfulness Meditation, Insight-Oriented, Family Systems, and Interpersonal ? ?Diagnosis:PTSD (post-traumatic stress disorder) ? ?Plan: Treatment Plan ? ?Strengths/Abilities:  Intelligent, supportive husband and mother, motivated, insightful  ? ?Treatment Preferences:  Outpatient Individual therapy ? ?Statement of Needs:  "I need some help with my anxiety and PTSD." ? ?Symptoms:  Excessive and/or unrealistic worry that is difficult to control occurring more days than not for at least 6  ?months about a number of events or activities.: (Status: maintained). Has been  ?exposed to a traumatic event involving actual or perceived threat of death or serious injury.:  (Status: maintained). Hypervigilance (e.g., feeling constantly on edge,  ?experiencing concentration difficulties, having trouble falling or staying asleep, exhibiting a general  ?state of irritability).(Status: improved). Impairment in social, occupational,  ?or other areas of functioning.:  (Status: improved). ? ?Problems Addressed: PTSD and Anxiety ? ?Goals:  ?LTG:  1. Eliminate or reduce the negative impact trauma related symptoms have  ?on social, occupational, and family functioning.  70% ?2. Stabilize anxiety level while increasing ability to function on a daily  70% ?STG:  Learn and implement problem solving strategies and implement mindfulness and meditation and exercise  60 % ?Target Date:04/07/2022 ?Frequency:  Weekly ?Modality:Individual ?Interventions by Therapist:  CBT, Problem solving, EMDR ? ?Patient progressing and approved this treatment plan. ? ?Sherrod Toothman G Zaahir Pickney, LCSW ? ? ? ? ? ? ? ? ? ? ? ? ? ? ? ? ? ?Logyn Dedominicis G Gomer France, LCSW ? ? ? ? ? ? ? ? ? ? ? ? ? ? ? ? ? ? ? ? ? ? ? ? ? ? ? ? ? ?  Deniesha Stenglein G Thersea Manfredonia, LCSW ? ? ? ? ? ? ? ? ? ? ? ? ? ? ?Shaquille Murdy G Berniece Abid, LCSW ? ? ? ? ? ? ? ? ? ? ? ? ? ? ?Jariana Shumard G Dmya Long, LCSW ? ? ? ? ? ? ? ? ? ? ? ? ? ? ?Adekunle Rohrbach G Herbert Marken, LCSW ? ? ? ? ? ? ? ? ? ? ? ? ? ? ?Toya Palacios G Davy Westmoreland,  LCSW ? ? ? ? ? ? ? ? ? ? ? ? ? ? ?Dajahnae Vondra G Teron Blais, LCSW ? ? ? ? ? ? ? ? ? ? ? ? ? ? ?Sahaj Bona G Iliany Losier, LCSW ? ? ? ? ? ? ? ? ? ? ? ? ? ? ?Christiano Blandon G Tandra Rosado, LCSW ?

## 2021-12-15 ENCOUNTER — Ambulatory Visit (INDEPENDENT_AMBULATORY_CARE_PROVIDER_SITE_OTHER): Payer: BC Managed Care – PPO | Admitting: Psychology

## 2021-12-15 DIAGNOSIS — F4310 Post-traumatic stress disorder, unspecified: Secondary | ICD-10-CM | POA: Diagnosis not present

## 2021-12-15 NOTE — Progress Notes (Signed)
[facility] Counselor/Therapist Progress Note ? ?Patient ID: Caitlyn Jackson, MRN: [id number],   ? ?Date: 12/15/2021 ? ?Time Spent: 60 minutes ? ?Treatment Type: Individual Therapy ? ?Reported Symptoms: anxiety ? ?Mental Status Exam: ?Appearance:  Casual     ?Behavior: Appropriate  ?Motor: Normal  ?Speech/Language:  Normal Rate  ?Affect: Appropriate  ?Mood: normal  ?Thought process: normal  ?Thought content:   WNL  ?Sensory/Perceptual disturbances:   WNL  ?Orientation: oriented to person, place, time/date, and situation  ?Attention: Good  ?Concentration: Good  ?Memory: WNL  ?Fund of knowledge:  Good  ?Insight:   Good  ?Judgment:  Good  ?Impulse Control: Good  ? ?Risk Assessment: ?Danger to Self:  No ?Self-injurious Behavior: No ?Danger to Others: No ?Duty to Warn:no ?Physical Aggression / Violence:No  ?Access to Firearms a concern: No  ?Gang Involvement:No  ? ?Subjective: The patient attended face-to-face individual therapy session via video visit today.  The patient gave verbal consent for the session to be on video on WebEx.  The patient was alone in her home and the therapist was in the office.  The patient presents as pleasant and cooperative.  The patient reports that things have been going very well with the new transition to her husband having the new job out of town.  The patient is taking good care of her children and managing things at home.  The patient talked about some issues she is having with her mother and we talked about some ways to handle those issues.  The patient is doing very well and has not been very anxious in quite some time so we decided to move our sessions to every 2 weeks starting this week. ? ?Interventions: Cognitive Behavioral Therapy, Mindfulness Meditation, Insight-Oriented, Family Systems, and Interpersonal ? ?Diagnosis:PTSD (post-traumatic stress disorder) ? ?Plan: Treatment Plan ? ?Strengths/Abilities:  Intelligent, supportive husband and mother, motivated,  insightful  ? ?Treatment Preferences:  Outpatient Individual therapy ? ?Statement of Needs:  "I need some help with my anxiety and PTSD." ? ?Symptoms:  Excessive and/or unrealistic worry that is difficult to control occurring more days than not for at least 6  ?months about a number of events or activities.: (Status: maintained). Has been  ?exposed to a traumatic event involving actual or perceived threat of death or serious injury.:  (Status: maintained). Hypervigilance (e.g., feeling constantly on edge,  ?experiencing concentration difficulties, having trouble falling or staying asleep, exhibiting a general  ?state of irritability).(Status: improved). Impairment in social, occupational,  ?or other areas of functioning.:  (Status: improved). ? ?Problems Addressed: PTSD and Anxiety ? ?Goals:  ?LTG:  1. Eliminate or reduce the negative impact trauma related symptoms have  ?on social, occupational, and family functioning.  70% ?2. Stabilize anxiety level while increasing ability to function on a daily  70% ?STG:  Learn and implement problem solving strategies and implement mindfulness and meditation and exercise  60 % ?Target Date:04/07/2022 ?Frequency:  Weekly ?Modality:Individual ?Interventions by Therapist:  CBT, Problem solving, EMDR ? ?Patient progressing and approved this treatment plan. ? ?Arlean Thies G Levonne Carreras, LCSW ? ? ? ? ? ? ? ? ? ? ? ? ? ? ? ? ? ?Shaquilla Kehres G Brain Honeycutt, LCSW ? ? ? ? ? ? ? ? ? ? ? ? ? ? ? ? ? ? ? ? ? ? ? ? ? ? ? ? ? ?Geral Tuch G Meron Bocchino, LCSW ? ? ? ? ? ? ? ? ? ? ? ? ? ? ?Saira Kramme G Kelvin Sennett, LCSW ? ? ? ? ? ? ? ? ? ? ? ? ? ? ?  Adorian Gwynne G Iylah Dworkin, LCSW ? ? ? ? ? ? ? ? ? ? ? ? ? ? ?Yezenia Fredrick G Talitha Dicarlo, LCSW ? ? ? ? ? ? ? ? ? ? ? ? ? ? ?Katricia Prehn G Tanice Petre, LCSW ? ? ? ? ? ? ? ? ? ? ? ? ? ? ?Shandale Malak G Sharne Linders, LCSW ? ? ? ? ? ? ? ? ? ? ? ? ? ? ?Wilmary Levit G Anacleto Batterman, LCSW ? ? ? ? ? ? ? ? ? ? ? ? ? ? ?Charna Neeb G Anabelen Kaminsky, LCSW ? ? ? ? ? ? ? ? ? ? ? ? ? ? ?Jerrett Baldinger G Quin Mathenia, LCSW ?

## 2021-12-22 ENCOUNTER — Ambulatory Visit: Payer: BC Managed Care – PPO | Admitting: Psychology

## 2021-12-29 ENCOUNTER — Ambulatory Visit (INDEPENDENT_AMBULATORY_CARE_PROVIDER_SITE_OTHER): Payer: BC Managed Care – PPO | Admitting: Psychology

## 2021-12-29 DIAGNOSIS — F4310 Post-traumatic stress disorder, unspecified: Secondary | ICD-10-CM | POA: Diagnosis not present

## 2021-12-29 NOTE — Progress Notes (Signed)
[facility] Counselor/Therapist Progress Note ? ?Patient ID: IVY PURYEAR, MRN: [id number],   ? ?Date: 12/29/2021 ? ?Time Spent: 60 minutes ? ?Treatment Type: Individual Therapy ? ?Reported Symptoms: anxiety ? ?Mental Status Exam: ?Appearance:  Casual     ?Behavior: Appropriate  ?Motor: Normal  ?Speech/Language:  Normal Rate  ?Affect: Appropriate  ?Mood: normal  ?Thought process: normal  ?Thought content:   WNL  ?Sensory/Perceptual disturbances:   WNL  ?Orientation: oriented to person, place, time/date, and situation  ?Attention: Good  ?Concentration: Good  ?Memory: WNL  ?Fund of knowledge:  Good  ?Insight:   Good  ?Judgment:  Good  ?Impulse Control: Good  ? ?Risk Assessment: ?Danger to Self:  No ?Self-injurious Behavior: No ?Danger to Others: No ?Duty to Warn:no ?Physical Aggression / Violence:No  ?Access to Firearms a concern: No  ?Gang Involvement:No  ? ?Subjective: The patient attended face-to-face individual therapy session via video visit today.  The patient gave verbal consent for the session to be on video on WebEx.  The patient was alone in her home and the therapist was in the office.  The patient presents as pleasant and cooperative.  The patient reports that she has been managing things fairly well over the last few weeks.  We went to every 2 weeks for our sessions and she seems to be doing fine.  The patient reports that she had a couple of sick situations that she had to handle but she is able to refer back to some of the books that she has read and pull herself back into mindful state and handle it.  The patient reports that she has not had any panic attacks or any problems with headaches.  The patient is still managing her children by herself and her mother by herself as her husband is working in [place].  The patient seems to be doing very well with the coping strategies she has learned. ? ?Interventions: Cognitive Behavioral Therapy, Mindfulness Meditation, Insight-Oriented,  Family Systems, and Interpersonal ? ?Diagnosis:PTSD (post-traumatic stress disorder) ? ?Plan: Treatment Plan ? ?Strengths/Abilities:  Intelligent, supportive husband and mother, motivated, insightful  ? ?Treatment Preferences:  Outpatient Individual therapy ? ?Statement of Needs:  "I need some help with my anxiety and PTSD." ? ?Symptoms:  Excessive and/or unrealistic worry that is difficult to control occurring more days than not for at least 6  ?months about a number of events or activities.: (Status: maintained). Has been  ?exposed to a traumatic event involving actual or perceived threat of death or serious injury.:  (Status: maintained). Hypervigilance (e.g., feeling constantly on edge,  ?experiencing concentration difficulties, having trouble falling or staying asleep, exhibiting a general  ?state of irritability).(Status: improved). Impairment in social, occupational,  ?or other areas of functioning.:  (Status: improved). ? ?Problems Addressed: PTSD and Anxiety ? ?Goals:  ?LTG:  1. Eliminate or reduce the negative impact trauma related symptoms have  ?on social, occupational, and family functioning.  80% ?2. Stabilize anxiety level while increasing ability to function on a daily  80% ?STG:  Learn and implement problem solving strategies and implement mindfulness and meditation and exercise  60 % ?Target Date:04/07/2022 ?Frequency: Bi weekly ?Modality:Individual ?Interventions by Therapist:  CBT, Problem solving, EMDR ? ?Patient progressing and approved this treatment plan. ? ?Arma Reining G Alera Quevedo, LCSW ? ? ? ? ? ? ? ? ? ? ? ? ? ? ? ? ? ?Florentina Marquart G Yonna Alwin, LCSW ? ? ? ? ? ? ? ? ? ? ? ? ? ? ? ? ? ? ? ? ? ? ? ? ? ? ? ? ? ?  Azeez Dunker G Karson Reede, LCSW ? ? ? ? ? ? ? ? ? ? ? ? ? ? ?Lakaisha Danish G Corlene Sabia, LCSW ? ? ? ? ? ? ? ? ? ? ? ? ? ? ?Damyra Luscher G Danney Bungert, LCSW ? ? ? ? ? ? ? ? ? ? ? ? ? ? ?Hadlea Furuya G Oshua Mcconaha, LCSW ? ? ? ? ? ? ? ? ? ? ? ? ? ? ?Alexee Delsanto G Breck Hollinger, LCSW ? ? ? ? ? ? ? ? ? ? ? ? ? ? ?Travaughn Vue G Etna Forquer, LCSW ? ? ? ? ? ? ? ? ? ? ? ? ? ? ?Quintyn Dombek  G Lucine Bilski, LCSW ? ? ? ? ? ? ? ? ? ? ? ? ? ? ?Kiara Mcdowell G Alyssha Housh, LCSW ? ? ? ? ? ? ? ? ? ? ? ? ? ? ?Yasmyn Bellisario G Phenix Grein, LCSW ? ? ? ? ? ? ? ? ? ? ? ? ? ? ?Jameeka Marcy G Taegan Standage, LCSW ?

## 2022-01-05 ENCOUNTER — Ambulatory Visit: Payer: BC Managed Care – PPO | Admitting: Psychology

## 2022-01-12 ENCOUNTER — Ambulatory Visit (INDEPENDENT_AMBULATORY_CARE_PROVIDER_SITE_OTHER): Payer: BC Managed Care – PPO | Admitting: Psychology

## 2022-01-12 DIAGNOSIS — F4310 Post-traumatic stress disorder, unspecified: Secondary | ICD-10-CM

## 2022-01-13 NOTE — Progress Notes (Signed)
[facility] Counselor/Therapist Progress Note ? ?Patient ID: Caitlyn Jackson, MRN: [id number],   ? ?Date: 01/12/2022 ? ?Time Spent: 60 minutes ? ?Treatment Type: Individual Therapy ? ?Reported Symptoms: anxiety ? ?Mental Status Exam: ?Appearance:  Casual     ?Behavior: Appropriate  ?Motor: Normal  ?Speech/Language:  Normal Rate  ?Affect: Appropriate  ?Mood: normal  ?Thought process: normal  ?Thought content:   WNL  ?Sensory/Perceptual disturbances:   WNL  ?Orientation: oriented to person, place, time/date, and situation  ?Attention: Good  ?Concentration: Good  ?Memory: WNL  ?Fund of knowledge:  Good  ?Insight:   Good  ?Judgment:  Good  ?Impulse Control: Good  ? ?Risk Assessment: ?Danger to Self:  No ?Self-injurious Behavior: No ?Danger to Others: No ?Duty to Warn:no ?Physical Aggression / Violence:No  ?Access to Firearms a concern: No  ?Gang Involvement:No  ? ?Subjective: The patient attended face-to-face individual therapy session via video visit today.  The patient gave verbal consent for the session to be on video on WebEx.  The patient was alone in her home and the therapist was in the office.  The patient presents with a blunted affect and mood is anxious.  The patient reports that she has been struggling a little bit more lately with her anxiety and we talked about what she has been doing in regard to taking care of of her children and her husband being away.  The patient seems to be managing things however it sounds like she is trying to cram and too much on the weekends.  When her husband comes home she states that she wants to do things with him and they are probably over scheduling themselves.  I recommended that she slow things down and that she get back into doing some sort of mindfulness and meditation on a regular basis as she lets that go by the wayside and is doing that in a rushed way. ? ?Interventions: Cognitive Behavioral Therapy, Mindfulness Meditation, Insight-Oriented, Family  Systems, and Interpersonal ? ?Diagnosis:PTSD (post-traumatic stress disorder) ? ?Plan: Treatment Plan ? ?Strengths/Abilities:  Intelligent, supportive husband and mother, motivated, insightful  ? ?Treatment Preferences:  Outpatient Individual therapy ? ?Statement of Needs:  "I need some help with my anxiety and PTSD." ? ?Symptoms:  Excessive and/or unrealistic worry that is difficult to control occurring more days than not for at least 6  ?months about a number of events or activities.: (Status: maintained). Has been  ?exposed to a traumatic event involving actual or perceived threat of death or serious injury.:  (Status: maintained). Hypervigilance (e.g., feeling constantly on edge,  ?experiencing concentration difficulties, having trouble falling or staying asleep, exhibiting a general  ?state of irritability).(Status: improved). Impairment in social, occupational,  ?or other areas of functioning.:  (Status: improved). ? ?Problems Addressed: PTSD and Anxiety ? ?Goals:  ?LTG:  1. Eliminate or reduce the negative impact trauma related symptoms have  ?on social, occupational, and family functioning.  80% ?2. Stabilize anxiety level while increasing ability to function on a daily  80% ?STG:  Learn and implement problem solving strategies and implement mindfulness and meditation and exercise  60 % ?Target Date:04/07/2022 ?Frequency: Bi weekly ?Modality:Individual ?Interventions by Therapist:  CBT, Problem solving, EMDR ? ?Patient progressing and approved this treatment plan. ? ?Prabhnoor Ellenberger G Starlette Thurow, LCSW ? ? ? ? ? ? ? ? ? ? ? ? ? ? ? ? ? ?Nesa Distel G Jeanann Balinski, LCSW ? ? ? ? ? ? ? ? ? ? ? ? ? ? ? ? ? ? ? ? ? ? ? ? ? ? ? ? ? ?  Catrinia Racicot G Davin Archuletta, LCSW ? ? ? ? ? ? ? ? ? ? ? ? ? ? ?Zorianna Taliaferro G Marisol Giambra, LCSW ? ? ? ? ? ? ? ? ? ? ? ? ? ? ?Preston Garabedian G Eulla Kochanowski, LCSW ? ? ? ? ? ? ? ? ? ? ? ? ? ? ?Keaisha Sublette G Vishal Sandlin, LCSW ? ? ? ? ? ? ? ? ? ? ? ? ? ? ?Koralyn Prestage G Wing Gfeller, LCSW ? ? ? ? ? ? ? ? ? ? ? ? ? ? ?Colleene Swarthout G Margart Zemanek, LCSW ? ? ? ? ? ? ? ? ? ? ? ? ? ? ?Dawn Kiper G  Duke Weisensel, LCSW ? ? ? ? ? ? ? ? ? ? ? ? ? ? ?Honor Frison G Tyshon Fanning, LCSW ? ? ? ? ? ? ? ? ? ? ? ? ? ? ?[place] Current G Ashonti Leandro, LCSW ? ? ? ? ? ? ? ? ? ? ? ? ? ? ?Yvonne Stopher G Rama Mcclintock, LCSW ? ? ? ? ? ? ? ? ? ? ? ? ? ? ?Analeia Ismael G Sabre Romberger, LCSW ?

## 2022-01-19 ENCOUNTER — Ambulatory Visit: Payer: BC Managed Care – PPO | Admitting: Psychology

## 2022-01-22 ENCOUNTER — Ambulatory Visit (INDEPENDENT_AMBULATORY_CARE_PROVIDER_SITE_OTHER): Payer: BC Managed Care – PPO | Admitting: Psychology

## 2022-01-22 DIAGNOSIS — F4310 Post-traumatic stress disorder, unspecified: Secondary | ICD-10-CM | POA: Diagnosis not present

## 2022-01-24 NOTE — Progress Notes (Signed)
[facility] Counselor/Therapist Progress Note  Patient ID: DEANIE JUPITER, MRN: [id number],    Date: 01/22/2022  Time Spent: 60 minutes  Treatment Type: Individual Therapy  Reported Symptoms: anxiety  Mental Status Exam: Appearance:  Casual     Behavior: Appropriate  Motor: Normal  Speech/Language:  Normal Rate  Affect: Appropriate  Mood: anxious  Thought process: normal  Thought content:   WNL  Sensory/Perceptual disturbances:   WNL  Orientation: oriented to person, place, time/date, and situation  Attention: Good  Concentration: Good  Memory: WNL  Fund of knowledge:  Good  Insight:   Good  Judgment:  Good  Impulse Control: Good   Risk Assessment: Danger to Self:  No Self-injurious Behavior: No Danger to Others: No Duty to Warn:no Physical Aggression / Violence:No  Access to Firearms a concern: No  Gang Involvement:No   Subjective: The patient attended face-to-face individual therapy session via video visit today.  The patient gave verbal consent for the session to be on video on WebEx.  The patient was alone in her home and the therapist was in the office.  The patient presents with a blunted affect and mood is anxious.  The patient reports that she had a panic attack earlier this week and called earlier for an appointment because she was feeling anxious like she did before.  We processed what happened and it seems that she got sunburned and looked it up on line and read that she could get cancer from being sunburned from  5 times.  We processed what she was thinking and it seems that she has been over stressed by having to take care of her children without her husband.  We talked about the need for her to be more balanced and also allow herself to feel the feelings that she has and verbalize these.  The patient admitted that she wanted to seem as if everything was fine for her husband.  We discussed the need to be her authentic self and to teach her  children how to do this. Interventions: Cognitive Behavioral Therapy, Mindfulness Meditation, Insight-Oriented, Family Systems, and Interpersonal  Diagnosis:PTSD (post-traumatic stress disorder)  Plan: Treatment Plan  Strengths/Abilities:  Intelligent, supportive husband and mother, motivated, insightful   Treatment Preferences:  Outpatient Individual therapy  Statement of Needs:  "I need some help with my anxiety and PTSD."  Symptoms:  Excessive and/or unrealistic worry that is difficult to control occurring more days than not for at least 6  months about a number of events or activities.: (Status: maintained). Has been  exposed to a traumatic event involving actual or perceived threat of death or serious injury.:  (Status: maintained). Hypervigilance (e.g., feeling constantly on edge,  experiencing concentration difficulties, having trouble falling or staying asleep, exhibiting a general  state of irritability).(Status: improved). Impairment in social, occupational,  or other areas of functioning.:  (Status: improved).  Problems Addressed: PTSD and Anxiety  Goals:  LTG:  1. Eliminate or reduce the negative impact trauma related symptoms have  on social, occupational, and family functioning.  80% 2. Stabilize anxiety level while increasing ability to function on a daily  80% STG:  Learn and implement problem solving strategies and implement mindfulness and meditation and exercise  60 % Target Date:04/07/2022 Frequency: Bi weekly Modality:Individual Interventions by Therapist:  CBT, Problem solving, EMDR  Patient progressing and approved this treatment plan.  Cinzia Devos G Deseree Zemaitis, LCSW  Cranford Blessinger G Sylena Lotter, LCSW                              Wayman Hoard G Tammela Bales, LCSW               Kamare Caspers G Santrice Muzio, LCSW               Graceland Wachter G Duanna Runk, LCSW               Gael Londo G Lorissa Kishbaugh,  LCSW               Trigo Winterbottom G Yostin Malacara, LCSW               Cedricka Sackrider G Kajuan Guyton, LCSW               Vanda Waskey G Jessy Cybulski, LCSW               Anothony Bursch G Aspen Lawrance, LCSW               Arsalan Brisbin G Dailah Opperman, LCSW               Shubham Thackston G Grayson Pfefferle, LCSW               Savvas Roper G Jaydian Santana, LCSW               Lilliann Rossetti G Chrystina Naff, LCSW

## 2022-01-26 ENCOUNTER — Ambulatory Visit (INDEPENDENT_AMBULATORY_CARE_PROVIDER_SITE_OTHER): Payer: BC Managed Care – PPO | Admitting: Psychology

## 2022-01-26 DIAGNOSIS — F4310 Post-traumatic stress disorder, unspecified: Secondary | ICD-10-CM

## 2022-01-26 NOTE — Progress Notes (Signed)
[facility] Counselor/Therapist Progress Note  Patient ID: GOLDENE FORTENBERRY, MRN: [id number],    Date: 01/26/2022  Time Spent: 60 minutes  Treatment Type: Individual Therapy  Reported Symptoms: anxiety  Mental Status Exam: Appearance:  Casual     Behavior: Appropriate  Motor: Normal  Speech/Language:  Normal Rate  Affect: Appropriate  Mood: normal  Thought process: normal  Thought content:   WNL  Sensory/Perceptual disturbances:   WNL  Orientation: oriented to person, place, time/date, and situation  Attention: Good  Concentration: Good  Memory: WNL  Fund of knowledge:  Good  Insight:   Good  Judgment:  Good  Impulse Control: Good   Risk Assessment: Danger to Self:  No Self-injurious Behavior: No Danger to Others: No Duty to Warn:no Physical Aggression / Violence:No  Access to Firearms a concern: No  Gang Involvement:No   Subjective: The patient attended face-to-face individual therapy session via video visit today.  The patient gave verbal consent for the session to be on video on WebEx.  The patient was alone in her home and the therapist was in the office.  The patient presents as pleasant and cooperative.  The patient reports that she is doing much better than she was last week.  The patient reports that she has gotten back into her meditation and mindfulness and has a schedule that she follows to try to get these things incorporated into her day.  The patient talked about going places with her husband and children and we talked about the need to balance those things out so that is not avoiding but just enjoying.  In addition we talked about the 4 tools of discipline and I explained those.  Interventions: Cognitive Behavioral Therapy, Mindfulness Meditation, Insight-Oriented, Family Systems, and Interpersonal  Diagnosis:PTSD (post-traumatic stress disorder)  Plan: Treatment Plan  Strengths/Abilities:  Intelligent, supportive husband and mother,  motivated, insightful   Treatment Preferences:  Outpatient Individual therapy  Statement of Needs:  "I need some help with my anxiety and PTSD."  Symptoms:  Excessive and/or unrealistic worry that is difficult to control occurring more days than not for at least 6  months about a number of events or activities.: (Status: maintained). Has been  exposed to a traumatic event involving actual or perceived threat of death or serious injury.:  (Status: maintained). Hypervigilance (e.g., feeling constantly on edge,  experiencing concentration difficulties, having trouble falling or staying asleep, exhibiting a general  state of irritability).(Status: improved). Impairment in social, occupational,  or other areas of functioning.:  (Status: improved).  Problems Addressed: PTSD and Anxiety  Goals:  LTG:  1. Eliminate or reduce the negative impact trauma related symptoms have  on social, occupational, and family functioning.  80% 2. Stabilize anxiety level while increasing ability to function on a daily  80% STG:  Learn and implement problem solving strategies and implement mindfulness and meditation and exercise  60 % Target Date:04/07/2022 Frequency: Bi weekly Modality:Individual Interventions by Therapist:  CBT, Problem solving, EMDR  Patient progressing and approved this treatment plan.  Demaya Hardge G Masiah Woody, LCSW                  Oryan Winterton G Donnika Kucher, LCSW                              Leilanny Fluitt G Tanyla Stege, LCSW               Vail Basista G Krishawn Vanderweele, LCSW  Rifky Lapre G Norinne Jeane, LCSW               Aylen Stradford G Nazir Hacker, LCSW               Cliffie Gingras G Kyley Laurel, LCSW               Esaias Cleavenger G Ahmere Hemenway, LCSW               Gaven Eugene G Hesham Womac, LCSW               Travell Desaulniers G Rande Dario, LCSW               Marguerite Jarboe G Nachum Derossett, LCSW               Taray Normoyle G Marchia Diguglielmo,  LCSW               Lorina Duffner G Anyely Cunning, LCSW               Ticara Waner G Kalob Bergen, LCSW               Khoi Hamberger G Junetta Hearn, LCSW

## 2022-02-02 ENCOUNTER — Ambulatory Visit: Payer: BC Managed Care – PPO | Admitting: Psychology

## 2022-02-09 ENCOUNTER — Ambulatory Visit (INDEPENDENT_AMBULATORY_CARE_PROVIDER_SITE_OTHER): Payer: BC Managed Care – PPO | Admitting: Psychology

## 2022-02-09 DIAGNOSIS — F4310 Post-traumatic stress disorder, unspecified: Secondary | ICD-10-CM

## 2022-02-09 NOTE — Progress Notes (Signed)
[facility] Counselor/Therapist Progress Note  Patient ID: Caitlyn Jackson, MRN: [id number],    Date: 02/09/2022  Time Spent: 60 minutes  Treatment Type: Individual Therapy  Reported Symptoms: anxiety  Mental Status Exam: Appearance:  Casual     Behavior: Appropriate  Motor: Normal  Speech/Language:  Normal Rate  Affect: Appropriate  Mood: normal  Thought process: normal  Thought content:   WNL  Sensory/Perceptual disturbances:   WNL  Orientation: oriented to person, place, time/date, and situation  Attention: Good  Concentration: Good  Memory: WNL  Fund of knowledge:  Good  Insight:   Good  Judgment:  Good  Impulse Control: Good   Risk Assessment: Danger to Self:  No Self-injurious Behavior: No Danger to Others: No Duty to Warn:no Physical Aggression / Violence:No  Access to Firearms a concern: No  Gang Involvement:No   Subjective: The patient attended face-to-face individual therapy session via video visit today.  The patient gave verbal consent for the session to be on video on WebEx.  The patient was alone in her home and the therapist was in the office.  The patient presents as pleasant and cooperative.  The patient reports that she has been doing well this week however she had 1 moment where she struggled a little bit with anxiety.  The patient realizes that when she focuses her energy on things that might go wrong she tends to get more anxious.  The patient talked about a situation with a person at church and her codependency with wanting to help this person.  I helped reframe the circumstance for the patient so that she did not feel negatively about trying to help the person however I did explain that some people may not be able to be helped because that is not truly what they want.  We also discussed her husband reading the book codependent no more and he finds that very helpful as well as the patient.  The patient seems to be doing well and managing  her anxiety this week and we will continue to encourage her to implement the tools that she has already obtained.  Interventions: Cognitive Behavioral Therapy, Mindfulness Meditation, Insight-Oriented, Family Systems, and Interpersonal  Diagnosis:PTSD (post-traumatic stress disorder)  Plan: Treatment Plan  Strengths/Abilities:  Intelligent, supportive husband and mother, motivated, insightful   Treatment Preferences:  Outpatient Individual therapy  Statement of Needs:  "I need some help with my anxiety and PTSD."  Symptoms:  Excessive and/or unrealistic worry that is difficult to control occurring more days than not for at least 6  months about a number of events or activities.: (Status: improved). Has been  exposed to a traumatic event involving actual or perceived threat of death or serious injury.:  (Status: maintained). Hypervigilance (e.g., feeling constantly on edge,  experiencing concentration difficulties, having trouble falling or staying asleep, exhibiting a general  state of irritability).(Status: improved). Impairment in social, occupational,  or other areas of functioning.:  (Status: improved).  Problems Addressed: PTSD and Anxiety  Goals:  LTG:  1. Eliminate or reduce the negative impact trauma related symptoms have  on social, occupational, and family functioning.  80% 2. Stabilize anxiety level while increasing ability to function on a daily  80% STG:  Learn and implement problem solving strategies and implement mindfulness and meditation and exercise  60 % Target Date:04/07/2022 Frequency: Bi weekly Modality:Individual Interventions by Therapist:  CBT, Problem solving, EMDR  Patient progressing and approved this treatment plan.  Deon Ivey G Brooklin Rieger, LCSW  Treesa Mccully G Lorilynn Lehr, LCSW                              Kearney Evitt G Froilan Mclean, LCSW               Garyn Arlotta G Amariona Rathje,  LCSW               Kaycee Haycraft G Iva Montelongo, LCSW               Arabia Nylund G Calistro Rauf, LCSW               Merrianne Mccumbers G Jameeka Marcy, LCSW               Jacquelyn Antony G Anusha Claus, LCSW               Maxfield Gildersleeve G Derricka Mertz, LCSW               Montrice Montuori G Jaunita Mikels, LCSW               Wesson Stith G Timothey Dahlstrom, LCSW               Georganna Maxson G Berdene Askari, LCSW               Daelon Dunivan G Zacari Stiff, LCSW               Forest Redwine G Jacarri Gesner, LCSW               Anahi Belmar G Charnetta Wulff, LCSW               Marquet Faircloth G Hendry Speas, LCSW

## 2022-02-16 ENCOUNTER — Ambulatory Visit: Payer: BC Managed Care – PPO | Admitting: Psychology

## 2022-02-23 ENCOUNTER — Ambulatory Visit (INDEPENDENT_AMBULATORY_CARE_PROVIDER_SITE_OTHER): Payer: BC Managed Care – PPO | Admitting: Psychology

## 2022-02-23 DIAGNOSIS — F4310 Post-traumatic stress disorder, unspecified: Secondary | ICD-10-CM | POA: Diagnosis not present

## 2022-02-24 NOTE — Progress Notes (Signed)
[facility] Counselor/Therapist Progress Note  Patient ID: Caitlyn Jackson, MRN: [id number],    Date: 02/23/2022  Time Spent: 60 minutes  Treatment Type: Individual Therapy  Reported Symptoms: anxiety  Mental Status Exam: Appearance:  Casual     Behavior: Appropriate  Motor: Normal  Speech/Language:  Normal Rate  Affect: Appropriate  Mood: normal  Thought process: normal  Thought content:   WNL  Sensory/Perceptual disturbances:   WNL  Orientation: oriented to person, place, time/date, and situation  Attention: Good  Concentration: Good  Memory: WNL  Fund of knowledge:  Good  Insight:   Good  Judgment:  Good  Impulse Control: Good   Risk Assessment: Danger to Self:  No Self-injurious Behavior: No Danger to Others: No Duty to Warn:no Physical Aggression / Violence:No  Access to Firearms a concern: No  Gang Involvement:No   Subjective: The patient attended face-to-face individual therapy session via video visit today.  The patient gave verbal consent for the session to be on video on WebEx.  The patient was alone in her home and the therapist was in the office.  The patient presents with a blunted affect and mood is anxious.  She states that she has not been doing a very good job of taking care of herself and she knows that that is why she is struggling some.  We talked about how to get back on track with her self-care so that she can making her taking her stability.  The patient seems to be managing her anxiety okay right now but knows that if she does not do those exercises that we have talked about before she will have more issues with panic.  The patient talked about going on a trip with her mother and we did some work around self talk and how to manage her negative thinking.  Interventions: Cognitive Behavioral Therapy, Mindfulness Meditation, Insight-Oriented, Family Systems, and Interpersonal  Diagnosis:PTSD (post-traumatic stress disorder)  Plan:  Treatment Plan  Strengths/Abilities:  Intelligent, supportive husband and mother, motivated, insightful   Treatment Preferences:  Outpatient Individual therapy  Statement of Needs:  "I need some help with my anxiety and PTSD."  Symptoms:  Excessive and/or unrealistic worry that is difficult to control occurring more days than not for at least 6  months about a number of events or activities.: (Status: improved). Has been  exposed to a traumatic event involving actual or perceived threat of death or serious injury.:  (Status: maintained). Hypervigilance (e.g., feeling constantly on edge,  experiencing concentration difficulties, having trouble falling or staying asleep, exhibiting a general  state of irritability).(Status: improved). Impairment in social, occupational,  or other areas of functioning.:  (Status: improved).  Problems Addressed: PTSD and Anxiety  Goals:  LTG:  1. Eliminate or reduce the negative impact trauma related symptoms have  on social, occupational, and family functioning.  80% 2. Stabilize anxiety level while increasing ability to function on a daily  80% STG:  Learn and implement problem solving strategies and implement mindfulness and meditation and exercise  60 % Target Date:04/07/2022 Frequency: Bi weekly Modality:Individual Interventions by Therapist:  CBT, Problem solving, EMDR  Patient progressing and approved this treatment plan.  Caitlyn Jackson G Caitlyn Biby, LCSW                  Caitlyn Jackson G Caitlyn Darcey, LCSW                              Caitlyn Jackson  G Caitlyn Delpilar, LCSW               Caitlyn Jackson G Caitlyn Labo, LCSW               Caitlyn Jackson G Caitlyn Canal, LCSW               Caitlyn Jackson G Caitlyn Headings, LCSW               Caitlyn Jackson G Caitlyn Mehlhoff, LCSW               Caitlyn Jackson G Caitlyn Huertas, LCSW               Caitlyn Jackson G Caitlyn Paolo, LCSW               Caitlyn Jackson G Caitlyn Tamer,  LCSW               Caitlyn Jackson G Caitlyn Nicholls, LCSW               Caitlyn Jackson G Caitlyn Mcbane, LCSW               Caitlyn Jackson G Caitlyn Butrick, LCSW               Caitlyn Jackson G Caitlyn Fuchs, LCSW               Caitlyn Jackson G Caitlyn Carmical, LCSW               Caitlyn Jackson G Caitlyn Mallozzi, LCSW               Caitlyn Jackson G Caitlyn Crilly, LCSW

## 2022-03-02 ENCOUNTER — Ambulatory Visit: Payer: BC Managed Care – PPO | Admitting: Psychology

## 2022-03-09 ENCOUNTER — Ambulatory Visit: Payer: BC Managed Care – PPO | Admitting: Psychology

## 2022-03-16 ENCOUNTER — Ambulatory Visit: Payer: BC Managed Care – PPO | Admitting: Psychology

## 2022-03-23 ENCOUNTER — Ambulatory Visit (INDEPENDENT_AMBULATORY_CARE_PROVIDER_SITE_OTHER): Payer: BC Managed Care – PPO | Admitting: Psychology

## 2022-03-23 DIAGNOSIS — F4310 Post-traumatic stress disorder, unspecified: Secondary | ICD-10-CM

## 2022-03-23 NOTE — Progress Notes (Signed)
[facility] Counselor/Therapist Progress Note  Patient ID: DEYSHA CARTIER, MRN: [id number],    Date: 03/23/2022  Time Spent: 60 minutes  Treatment Type: Individual Therapy  Reported Symptoms: anxiety  Mental Status Exam: Appearance:  Casual     Behavior: Appropriate  Motor: Normal  Speech/Language:  Normal Rate  Affect: Appropriate  Mood: normal  Thought process: normal  Thought content:   WNL  Sensory/Perceptual disturbances:   WNL  Orientation: oriented to person, place, time/date, and situation  Attention: Good  Concentration: Good  Memory: WNL  Fund of knowledge:  Good  Insight:   Good  Judgment:  Good  Impulse Control: Good   Risk Assessment: Danger to Self:  No Self-injurious Behavior: No Danger to Others: No Duty to Warn:no Physical Aggression / Violence:No  Access to Firearms a concern: No  Gang Involvement:No   Subjective: The patient attended face-to-face individual therapy session via video visit today.  The patient gave verbal consent for the session to be on video on WebEx.  The patient was alone in her home and the therapist was in the office.  The patient presents as pleasant and cooperative.  The patient and her mother and her children are getting ready to leave for a trip to [place] and [place] in the next week.  The patient reports that she has been doing a better job of taking care of herself and managing her anxiety.  The patient talked about having some anxious thoughts about flying but was able to give herself positive statements that we have talked about in order for her to feel better.  I recommended that she give herself statements from herself as opposed to from me as external statements.  The patient continues to do meditation and mindfulness and seems to be handling her anxiety pretty well we talked about possibly going to monthly for visits after our next session.  Interventions: Cognitive Behavioral Therapy, Mindfulness  Meditation, Insight-Oriented, Family Systems, and Interpersonal  Diagnosis:PTSD (post-traumatic stress disorder)  Plan: Treatment Plan  Strengths/Abilities:  Intelligent, supportive husband and mother, motivated, insightful   Treatment Preferences:  Outpatient Individual therapy  Statement of Needs:  "I need some help with my anxiety and PTSD."  Symptoms:  Excessive and/or unrealistic worry that is difficult to control occurring more days than not for at least 6  months about a number of events or activities.: (Status: improved). Has been  exposed to a traumatic event involving actual or perceived threat of death or serious injury.:  (Status: maintained). Hypervigilance (e.g., feeling constantly on edge,  experiencing concentration difficulties, having trouble falling or staying asleep, exhibiting a general  state of irritability).(Status: improved). Impairment in social, occupational,  or other areas of functioning.:  (Status: improved).  Problems Addressed: PTSD and Anxiety  Goals:  LTG:  1. Eliminate or reduce the negative impact trauma related symptoms have  on social, occupational, and family functioning.  80% 2. Stabilize anxiety level while increasing ability to function on a daily  80% STG:  Learn and implement problem solving strategies and implement mindfulness and meditation and exercise  60 % Target Date:04/08/2023 Frequency: Bi weekly Modality:Individual Interventions by Therapist:  CBT, Problem solving, EMDR  Patient progressing and approved this treatment plan.  Lindley Stachnik G Rumi Kolodziej, LCSW                  Pamlea Finder G Kathia Covington, LCSW  Viyan Rosamond G Wisdom Seybold, LCSW               Yigit Norkus G Karely Hurtado, LCSW               Saleh Ulbrich G Redell Bhandari, LCSW               Tyyonna Soucy G Jaziah Goeller, LCSW               Mariely Mahr G Jader Desai, LCSW               Jackelyn Illingworth G Winferd Wease,  LCSW               Florice Hindle G Britteny Fiebelkorn, LCSW               Lovie Agresta [employer], LCSW               Aarik Blank G Diarra Ceja, LCSW               Tiffanye Hartmann G Amrom Ore, LCSW               Aime Meloche G Jalonda Antigua, LCSW               Keasia Dubose G Kyshaun Barnette, LCSW               Malayja Freund G Yina Riviere, LCSW               Sreekar Broyhill G Hendel Gatliff, LCSW               Shermika Balthaser G Tiawanna Luchsinger, LCSW               Isiaah Cuervo G Adiah Guereca, LCSW

## 2022-03-30 ENCOUNTER — Ambulatory Visit: Payer: BC Managed Care – PPO | Admitting: Psychology

## 2022-04-06 ENCOUNTER — Ambulatory Visit: Payer: BC Managed Care – PPO | Admitting: Psychology

## 2022-04-13 ENCOUNTER — Ambulatory Visit: Payer: BC Managed Care – PPO | Admitting: Psychology

## 2022-04-20 ENCOUNTER — Ambulatory Visit (INDEPENDENT_AMBULATORY_CARE_PROVIDER_SITE_OTHER): Payer: BC Managed Care – PPO | Admitting: Psychology

## 2022-04-20 DIAGNOSIS — F4310 Post-traumatic stress disorder, unspecified: Secondary | ICD-10-CM | POA: Diagnosis not present
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
## 2022-04-27 ENCOUNTER — Ambulatory Visit: Payer: BC Managed Care – PPO | Admitting: Psychology

## 2022-05-04 ENCOUNTER — Ambulatory Visit: Payer: BC Managed Care – PPO | Admitting: Psychology

## 2022-05-11 ENCOUNTER — Ambulatory Visit: Payer: BC Managed Care – PPO | Admitting: Psychology

## 2022-05-18 ENCOUNTER — Ambulatory Visit (INDEPENDENT_AMBULATORY_CARE_PROVIDER_SITE_OTHER): Payer: BC Managed Care – PPO | Admitting: Psychology

## 2022-05-18 DIAGNOSIS — F4310 Post-traumatic stress disorder, unspecified: Secondary | ICD-10-CM
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
## 2022-05-25 ENCOUNTER — Ambulatory Visit: Payer: BC Managed Care – PPO | Admitting: Psychology

## 2022-06-01 ENCOUNTER — Ambulatory Visit (INDEPENDENT_AMBULATORY_CARE_PROVIDER_SITE_OTHER): Payer: BC Managed Care – PPO | Admitting: Psychology

## 2022-06-01 ENCOUNTER — Ambulatory Visit: Payer: BC Managed Care – PPO | Admitting: Psychology

## 2022-06-01 DIAGNOSIS — F4310 Post-traumatic stress disorder, unspecified: Secondary | ICD-10-CM
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
## 2022-06-08 ENCOUNTER — Ambulatory Visit: Payer: BC Managed Care – PPO | Admitting: Psychology

## 2022-06-15 ENCOUNTER — Ambulatory Visit (INDEPENDENT_AMBULATORY_CARE_PROVIDER_SITE_OTHER): Payer: BC Managed Care – PPO | Admitting: Psychology

## 2022-06-15 DIAGNOSIS — F4310 Post-traumatic stress disorder, unspecified: Secondary | ICD-10-CM | POA: Diagnosis not present
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
## 2022-06-22 ENCOUNTER — Ambulatory Visit: Payer: BC Managed Care – PPO | Admitting: Psychology

## 2022-06-29 ENCOUNTER — Ambulatory Visit (INDEPENDENT_AMBULATORY_CARE_PROVIDER_SITE_OTHER): Payer: BC Managed Care – PPO | Admitting: Psychology

## 2022-06-29 DIAGNOSIS — F4310 Post-traumatic stress disorder, unspecified: Secondary | ICD-10-CM | POA: Diagnosis not present
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
## 2022-07-06 ENCOUNTER — Ambulatory Visit: Payer: BC Managed Care – PPO | Admitting: Psychology

## 2022-07-13 ENCOUNTER — Ambulatory Visit: Payer: BC Managed Care – PPO | Admitting: Psychology

## 2022-07-20 ENCOUNTER — Ambulatory Visit: Payer: BC Managed Care – PPO | Admitting: Psychology

## 2022-07-27 ENCOUNTER — Ambulatory Visit (INDEPENDENT_AMBULATORY_CARE_PROVIDER_SITE_OTHER): Payer: BC Managed Care – PPO | Admitting: Psychology

## 2022-07-27 DIAGNOSIS — F4310 Post-traumatic stress disorder, unspecified: Secondary | ICD-10-CM
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
## 2022-08-03 ENCOUNTER — Ambulatory Visit: Payer: BC Managed Care – PPO | Admitting: Psychology

## 2022-08-10 ENCOUNTER — Ambulatory Visit: Payer: BC Managed Care – PPO | Admitting: Psychology

## 2022-08-17 ENCOUNTER — Ambulatory Visit: Payer: BC Managed Care – PPO | Admitting: Psychology

## 2022-08-24 ENCOUNTER — Ambulatory Visit (INDEPENDENT_AMBULATORY_CARE_PROVIDER_SITE_OTHER): Payer: BC Managed Care – PPO | Admitting: Psychology

## 2022-08-24 DIAGNOSIS — F4310 Post-traumatic stress disorder, unspecified: Secondary | ICD-10-CM | POA: Diagnosis not present

## 2022-08-25 NOTE — Progress Notes (Signed)
[facility] Counselor/Therapist Progress Note  Patient ID: Caitlyn Jackson, MRN: [id number],    Date: 08/24/2022/2023  Time Spent: 60 minutes  Treatment Type: Individual Therapy  Reported Symptoms: anxiety  Mental Status Exam: Appearance:  Casual     Behavior: Appropriate  Motor: Normal  Speech/Language:  Normal Rate  Affect: Appropriate  Mood: normal  Thought process: normal  Thought content:   WNL  Sensory/Perceptual disturbances:   WNL  Orientation: oriented to person, place, time/date, and situation  Attention: Good  Concentration: Good  Memory: WNL  Fund of knowledge:  Good  Insight:   Good  Judgment:  Good  Impulse Control: Good   Risk Assessment: Danger to Self:  No Self-injurious Behavior: No Danger to Others: No Duty to Warn:no Physical Aggression / Violence:No  Access to Firearms a concern: No  Gang Involvement:No   Subjective: The patient attended face-to-face individual therapy session via video visit today.  The patient gave verbal consent for the session to be on video on WebEx.  The patient was alone in her home and the therapist was in the office.  The patient presents as pleasant and cooperative.  The patient reports that she has been doing well.  She did talk about a disagreement that she had with her husband.  We processed how she handled it and it seems that she has done a good job of managing that.  The patient also talked about a situation that happened at church and we processed how she managed it and how to look at it differently so that she does not feel negatively about it.  The patient understood the concepts discussed and is doing very well and not having any anxiety or symptoms of PTSD.  Interventions: Cognitive Behavioral Therapy, Mindfulness Meditation, Insight-Oriented, Family Systems, and Interpersonal  Diagnosis:PTSD (post-traumatic stress disorder)  Plan: Treatment Plan  Strengths/Abilities:  Intelligent, supportive  husband and mother, motivated, insightful   Treatment Preferences:  Outpatient Individual therapy  Statement of Needs:  "I need some help with my anxiety and PTSD."  Symptoms:  Excessive and/or unrealistic worry that is difficult to control occurring more days than not for at least 6  months about a number of events or activities.: (Status: improved). Has been  exposed to a traumatic event involving actual or perceived threat of death or serious injury.:  (Status: maintained). Hypervigilance (e.g., feeling constantly on edge,  experiencing concentration difficulties, having trouble falling or staying asleep, exhibiting a general  state of irritability).(Status: improved). Impairment in social, occupational,  or other areas of functioning.:  (Status: improved).  Problems Addressed: PTSD and Anxiety  Goals:  LTG:  1. Eliminate or reduce the negative impact trauma related symptoms have  on social, occupational, and family functioning.  90% 2. Stabilize anxiety level while increasing ability to function on a daily  90% STG:  Learn and implement problem solving strategies and implement mindfulness and meditation and exercise  70 % Target Date:04/08/2023 Frequency: monthly Modality:Individual Interventions by Therapist:  CBT, Problem solving, EMDR  Patient progressing and approved this treatment plan.  Jeris Roser G Doran Nestle, LCSW

## 2022-08-31 ENCOUNTER — Ambulatory Visit: Payer: BC Managed Care – PPO | Admitting: Psychology

## 2022-09-21 ENCOUNTER — Ambulatory Visit (INDEPENDENT_AMBULATORY_CARE_PROVIDER_SITE_OTHER): Payer: BC Managed Care – PPO | Admitting: Psychology

## 2022-09-21 DIAGNOSIS — F4310 Post-traumatic stress disorder, unspecified: Secondary | ICD-10-CM

## 2022-09-22 NOTE — Progress Notes (Signed)
[place] [facility] Counselor/Therapist Progress Note  Patient ID: BATSHEVA STEVICK, MRN: [id number],    Date: 09/21/2022  Time Spent: 60 minutes  Treatment Type: Individual Therapy  Reported Symptoms: anxiety  Mental Status Exam: Appearance:  Casual     Behavior: Appropriate  Motor: Normal  Speech/Language:  Normal Rate  Affect: Appropriate  Mood: normal  Thought process: normal  Thought content:   WNL  Sensory/Perceptual disturbances:   WNL  Orientation: oriented to person, place, time/date, and situation  Attention: Good  Concentration: Good  Memory: WNL  Fund of knowledge:  Good  Insight:   Good  Judgment:  Good  Impulse Control: Good   Risk Assessment: Danger to Self:  No Self-injurious Behavior: No Danger to Others: No Duty to Warn:no Physical Aggression / Violence:No  Access to Firearms a concern: No  Gang Involvement:No   Subjective: The patient attended face-to-face individual therapy session via video visit today.  The patient gave verbal consent for the session to be on video on WebEx.  The patient was alone in her home and the therapist was in the office.  The patient presents as pleasant and cooperative.  The patient reports that she and her husband and her mother had another disagreement which seem to cause some issues.  The patient's mother overstep her boundaries with her husband and the children and apparently they got into an argument about it.  The patient seems to have managed things well and they ended work up working things out.  The patient is doing very well with her anxiety and she reports that she needs to do a better job of meditating and spending time on taking care of herself as she knows this is something that is better for her.  She seems to be managing things well and does not seem to be having the same issues that she had before.  She did report that she was holding some tension in her neck and the back of her head but did not come to  the conclusion that she was going to die from this.  She realizes that is probably stress related and I recommended that she consider getting a massage and working it out and getting back into her exercise and meditation and mindfulness.  The patient is on a monthly visit schedule and doing well. Interventions: Cognitive Behavioral Therapy, Mindfulness Meditation, Insight-Oriented, Family Systems, and Interpersonal  Diagnosis:PTSD (post-traumatic stress disorder)  Plan: Treatment Plan  Strengths/Abilities:  Intelligent, supportive husband and mother, motivated, insightful   Treatment Preferences:  Outpatient Individual therapy  Statement of Needs:  "I need some help with my anxiety and PTSD."  Symptoms:  Excessive and/or unrealistic worry that is difficult to control occurring more days than not for at least 6  months about a number of events or activities.: (Status: improved). Has been  exposed to a traumatic event involving actual or perceived threat of death or serious injury.:  (Status: maintained). Hypervigilance (e.g., feeling constantly on edge,  experiencing concentration difficulties, having trouble falling or staying asleep, exhibiting a general  state of irritability).(Status: improved). Impairment in social, occupational,  or other areas of functioning.:  (Status: improved).  Problems Addressed: PTSD and Anxiety  Goals:  LTG:  1. Eliminate or reduce the negative impact trauma related symptoms have  on social, occupational, and family functioning.  90% 2. Stabilize anxiety level while increasing ability to function on a daily  90% STG:  Learn and implement problem solving strategies and implement mindfulness and meditation  and exercise  70 % Target Date:04/08/2023 Frequency: monthly Modality:Individual Interventions by Therapist:  CBT, Problem solving, EMDR  Patient progressing and approved this treatment plan.  Ronnesha Mester G Melville Engen,  LCSW

## 2022-10-19 ENCOUNTER — Ambulatory Visit (INDEPENDENT_AMBULATORY_CARE_PROVIDER_SITE_OTHER): Payer: BC Managed Care – PPO | Admitting: Psychology

## 2022-10-19 DIAGNOSIS — F4310 Post-traumatic stress disorder, unspecified: Secondary | ICD-10-CM

## 2022-10-20 NOTE — Progress Notes (Signed)
[facility] Counselor/Therapist Progress Note  Patient ID: IRHAA MAGNIN, MRN: [id number],    Date: 10/19/2022  Time Spent: 60 minutes  Treatment Type: Individual Therapy  Reported Symptoms: anxiety  Mental Status Exam: Appearance:  Casual     Behavior: Appropriate  Motor: Normal  Speech/Language:  Normal Rate  Affect: Appropriate  Mood: normal  Thought process: normal  Thought content:   WNL  Sensory/Perceptual disturbances:   WNL  Orientation: oriented to person, place, time/date, and situation  Attention: Good  Concentration: Good  Memory: WNL  Fund of knowledge:  Good  Insight:   Good  Judgment:  Good  Impulse Control: Good   Risk Assessment: Danger to Self:  No Self-injurious Behavior: No Danger to Others: No Duty to Warn:no Physical Aggression / Violence:No  Access to Firearms a concern: No  Gang Involvement:No   Subjective: The patient attended face-to-face individual therapy session via video visit today.  The patient gave verbal consent for the session to be on video on WebEx.  The patient was alone in her home and the therapist was in the office.  The patient presents as pleasant and cooperative.  The patient reports that she has been doing okay with her anxiety.  She states that she was on vacation and has not done her meditation and mindfulness as much but she is still managing things.  We talked about how she is handling things and it seems that she is utilizing the tools that she knows to use and is doing much better with handling problems that she is dealing with.  We talked about the possibility of her graduating soon and she said that she would prefer to wait and make that decision during our next session.  We will discuss how she continues to manage and possibly make a decision for her to graduate from therapy. Interventions: Cognitive Behavioral Therapy, Mindfulness Meditation, Insight-Oriented, Family Systems, and  Interpersonal  Diagnosis:PTSD (post-traumatic stress disorder)  Plan: Treatment Plan  Strengths/Abilities:  Intelligent, supportive husband and mother, motivated, insightful   Treatment Preferences:  Outpatient Individual therapy  Statement of Needs:  "I need some help with my anxiety and PTSD."  Symptoms:  Excessive and/or unrealistic worry that is difficult to control occurring more days than not for at least 6  months about a number of events or activities.: (Status: improved). Has been  exposed to a traumatic event involving actual or perceived threat of death or serious injury.:  (Status: maintained). Hypervigilance (e.g., feeling constantly on edge,  experiencing concentration difficulties, having trouble falling or staying asleep, exhibiting a general  state of irritability).(Status: improved). Impairment in social, occupational,  or other areas of functioning.:  (Status: improved).  Problems Addressed: PTSD and Anxiety  Goals:  LTG:  1. Eliminate or reduce the negative impact trauma related symptoms have  on social, occupational, and family functioning.  90% 2. Stabilize anxiety level while increasing ability to function on a daily  90% STG:  Learn and implement problem solving strategies and implement mindfulness and meditation and exercise  80 % Target Date:04/08/2023 Frequency: monthly Modality:Individual Interventions by Therapist:  CBT, Problem solving, EMDR  Patient progressing and approved this treatment plan.  Twyla Dais G Nolawi Kanady, LCSW

## 2022-11-16 ENCOUNTER — Ambulatory Visit (INDEPENDENT_AMBULATORY_CARE_PROVIDER_SITE_OTHER): Payer: BC Managed Care – PPO | Admitting: Psychology

## 2022-11-16 DIAGNOSIS — F4310 Post-traumatic stress disorder, unspecified: Secondary | ICD-10-CM

## 2022-11-17 NOTE — Progress Notes (Signed)
[facility] Counselor/Therapist Progress Note  Patient ID: Caitlyn Jackson, MRN: [id number],    Date: 11/16/2022  Time Spent: 60 minutes  Treatment Type: Individual Therapy  Reported Symptoms: anxiety  Mental Status Exam: Appearance:  Casual     Behavior: Appropriate  Motor: Normal  Speech/Language:  Normal Rate  Affect: Appropriate  Mood: normal  Thought process: normal  Thought content:   WNL  Sensory/Perceptual disturbances:   WNL  Orientation: oriented to person, place, time/date, and situation  Attention: Good  Concentration: Good  Memory: WNL  Fund of knowledge:  Good  Insight:   Good  Judgment:  Good  Impulse Control: Good   Risk Assessment: Danger to Self:  No Self-injurious Behavior: No Danger to Others: No Duty to Warn:no Physical Aggression / Violence:No  Access to Firearms a concern: No  Gang Involvement:No   Subjective: The patient attended face-to-face individual therapy session via video visit today.  The patient gave verbal consent for the session to be on video on WebEx.  The patient was alone in her home and the therapist was in the office.  The patient presents as pleasant and cooperative.  The patient does present as a little more anxious today.  She reports that she would like to continue therapy monthly at least for now because she does not quite feel ready yet to graduate.  We talked today about the situation with her mother and her children.  I made some recommendations on how to possibly do some things differently with her mother and her children.  The patient was receptive to these ideas.  In addition we talked about a situation with her brother-in-law and apparently he did some things that were not above the board and in some ways the patient has had to intervene because it seemed to be dangerous situation with other people.  We talked about safety and security and how to handle the situation if he should show up at her home.  The  patient understood concepts discussed.  Interventions: Cognitive Behavioral Therapy, Mindfulness Meditation, Insight-Oriented, Family Systems, and Interpersonal  Diagnosis:PTSD (post-traumatic stress disorder)  Plan: Treatment Plan  Strengths/Abilities:  Intelligent, supportive husband and mother, motivated, insightful   Treatment Preferences:  Outpatient Individual therapy  Statement of Needs:  "I need some help with my anxiety and PTSD."  Symptoms:  Excessive and/or unrealistic worry that is difficult to control occurring more days than not for at least 6  months about a number of events or activities.: (Status: improved). Has been  exposed to a traumatic event involving actual or perceived threat of death or serious injury.:  (Status: maintained). Hypervigilance (e.g., feeling constantly on edge,  experiencing concentration difficulties, having trouble falling or staying asleep, exhibiting a general  state of irritability).(Status: improved). Impairment in social, occupational,  or other areas of functioning.:  (Status: improved).  Problems Addressed: PTSD and Anxiety  Goals:  LTG:  1. Eliminate or reduce the negative impact trauma related symptoms have  on social, occupational, and family functioning.  90% 2. Stabilize anxiety level while increasing ability to function on a daily  90% STG:  Learn and implement problem solving strategies and implement mindfulness and meditation and exercise  80 % Target Date:04/08/2023 Frequency: monthly Modality:Individual Interventions by Therapist:  CBT, Problem solving, EMDR  Patient progressing and approved this treatment plan.  Konner Warrior G Jonnie Truxillo, LCSW

## 2022-12-14 ENCOUNTER — Ambulatory Visit (INDEPENDENT_AMBULATORY_CARE_PROVIDER_SITE_OTHER): Payer: BC Managed Care – PPO | Admitting: Psychology

## 2022-12-14 DIAGNOSIS — F4310 Post-traumatic stress disorder, unspecified: Secondary | ICD-10-CM

## 2022-12-14 NOTE — Progress Notes (Signed)
[facility] Counselor/Therapist Progress Note  Patient ID: JATANNA STAMPLEY, MRN: [id number],    Date: 12/14/2022  Time Spent: 60 minutes  Treatment Type: Individual Therapy  Reported Symptoms: anxiety  Mental Status Exam: Appearance:  Casual     Behavior: Appropriate  Motor: Normal  Speech/Language:  Normal Rate  Affect: Appropriate  Mood: normal  Thought process: normal  Thought content:   WNL  Sensory/Perceptual disturbances:   WNL  Orientation: oriented to person, place, time/date, and situation  Attention: Good  Concentration: Good  Memory: WNL  Fund of knowledge:  Good  Insight:   Good  Judgment:  Good  Impulse Control: Good   Risk Assessment: Danger to Self:  No Self-injurious Behavior: No Danger to Others: No Duty to Warn:no Physical Aggression / Violence:No  Access to Firearms a concern: No  Gang Involvement:No   Subjective: The patient attended face-to-face individual therapy session via video visit today.  The patient gave verbal consent for the session to be on video on WebEx.  The patient was alone in her home and the therapist was in the office.  The patient presents as pleasant and cooperative.  The patient reports that she feels like she is ready to graduate now.  We talked about how she came to that conclusion and it does seem that she is using her coping strategies to be able to manage her life.  I did explain that if she wanted to come back to therapy of any point she could but that she was doing very well and she was managing her life.  The patient feels good about being able to graduate and we discussed a few of the situations that she has going on but she is managing all of those things very well she is using the tools we talked about.  Interventions: Cognitive Behavioral Therapy, Mindfulness Meditation, Insight-Oriented, Family Systems, and Interpersonal  Diagnosis:PTSD (post-traumatic stress disorder)  Plan: Treatment  Plan  Strengths/Abilities:  Intelligent, supportive husband and mother, motivated, insightful   Treatment Preferences:  Outpatient Individual therapy  Statement of Needs:  "I need some help with my anxiety and PTSD."  Symptoms:  Excessive and/or unrealistic worry that is difficult to control occurring more days than not for at least 6  months about a number of events or activities.: (Status: improved). Has been  exposed to a traumatic event involving actual or perceived threat of death or serious injury.:  (Status: maintained). Hypervigilance (e.g., feeling constantly on edge,  experiencing concentration difficulties, having trouble falling or staying asleep, exhibiting a general  state of irritability).(Status: improved). Impairment in social, occupational,  or other areas of functioning.:  (Status: improved).  Problems Addressed: PTSD and Anxiety  Goals:  LTG:  1. Eliminate or reduce the negative impact trauma related symptoms have  on social, occupational, and family functioning.  90% 2. Stabilize anxiety level while increasing ability to function on a daily  90% STG:  Learn and implement problem solving strategies and implement mindfulness and meditation and exercise  90 % Target Date:04/08/2023 Frequency: monthly Modality:Individual Interventions by Therapist:  CBT, Problem solving, EMDR  Patient progressing and approved this treatment plan.  Josephine Rudnick G Yatzary Merriweather, LCSW

## 2023-01-11 ENCOUNTER — Ambulatory Visit: Payer: BC Managed Care – PPO | Admitting: Psychology

## 2023-08-05 IMAGING — DX DG FOOT COMPLETE 3+V*R*
3 series · 3 of 3 positions shown · non-contrast
Comparison: None.

CLINICAL DATA: Pain

EXAM:
RIGHT FOOT COMPLETE - 3+ VIEW

[foot ap wb]
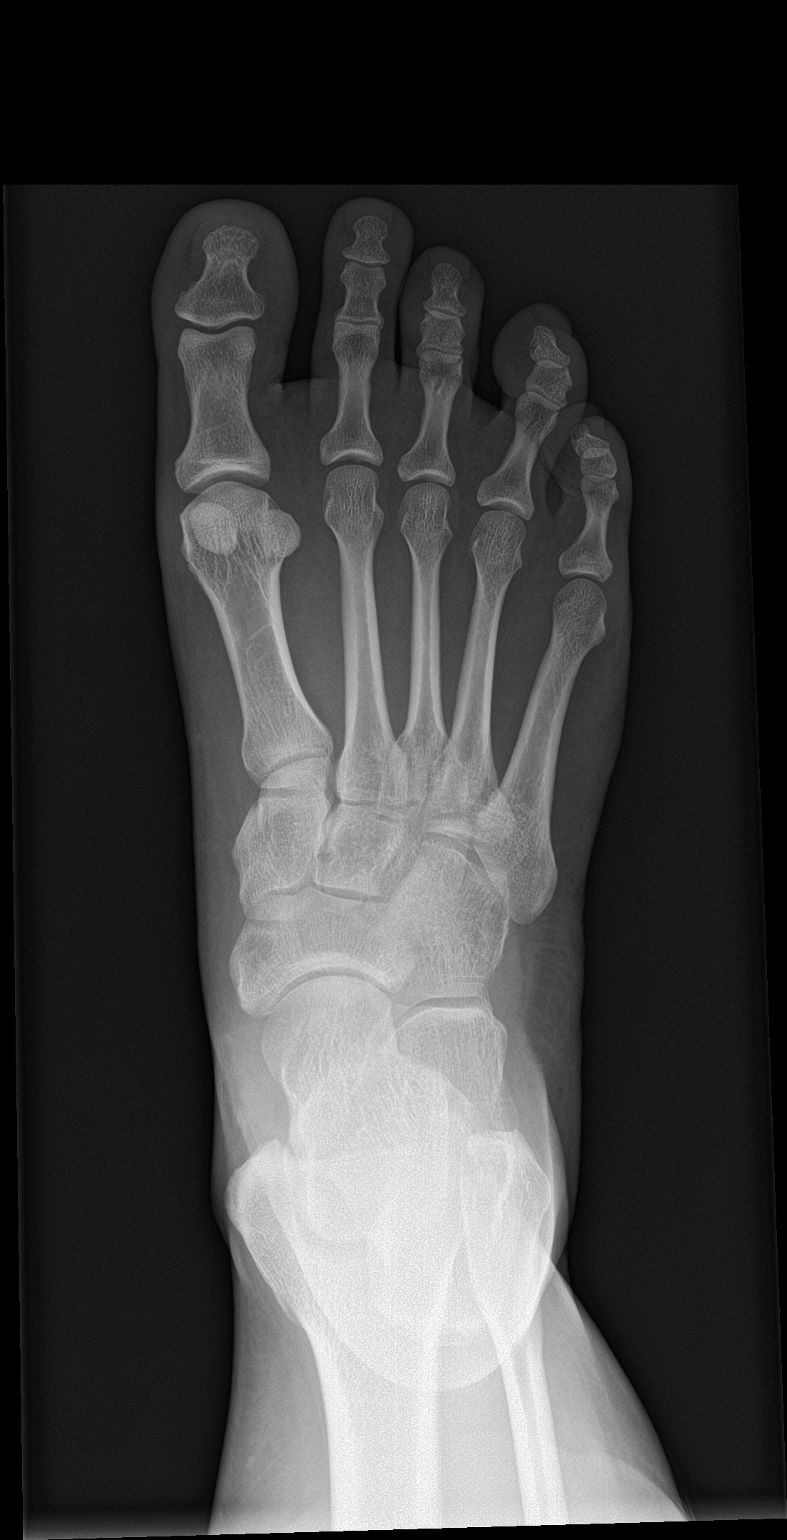

[foot obl wb]
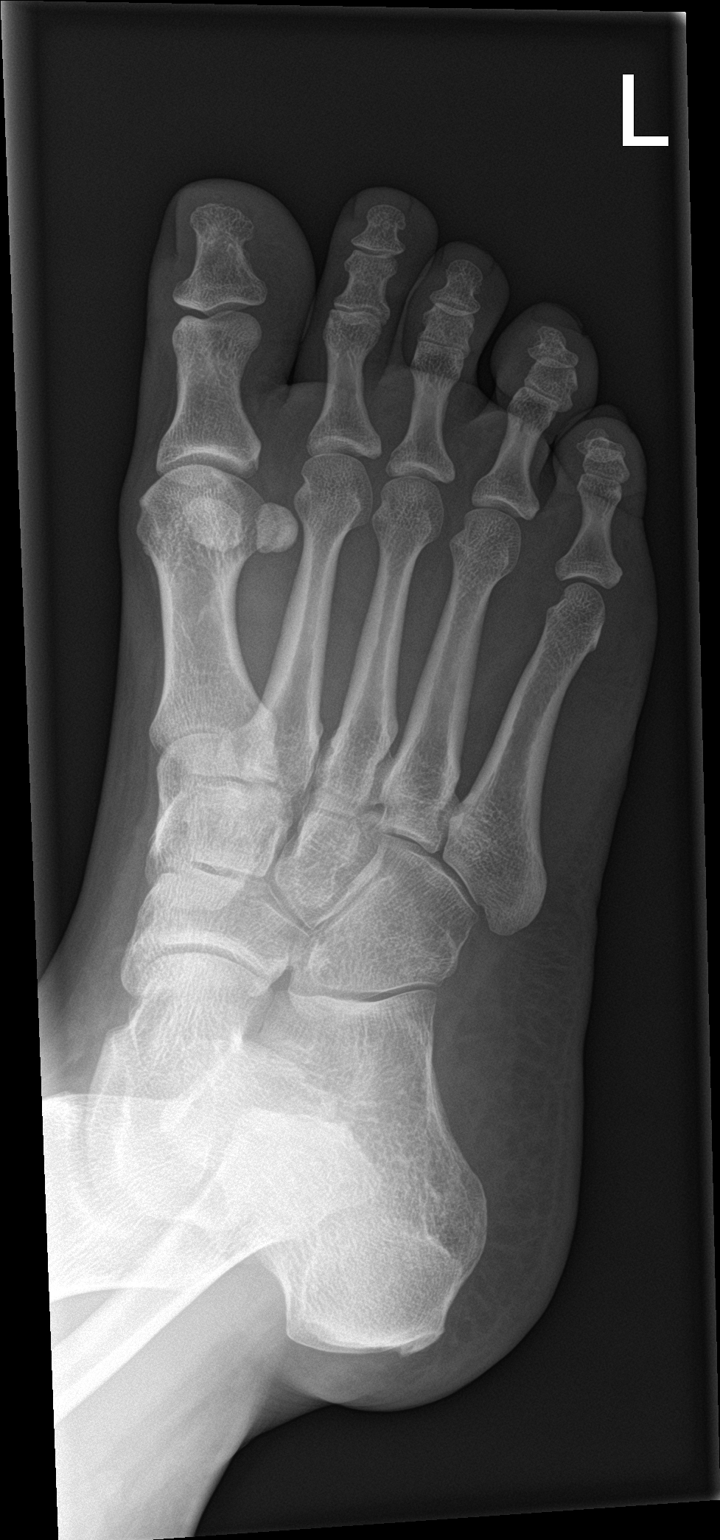

[foot lat wb]
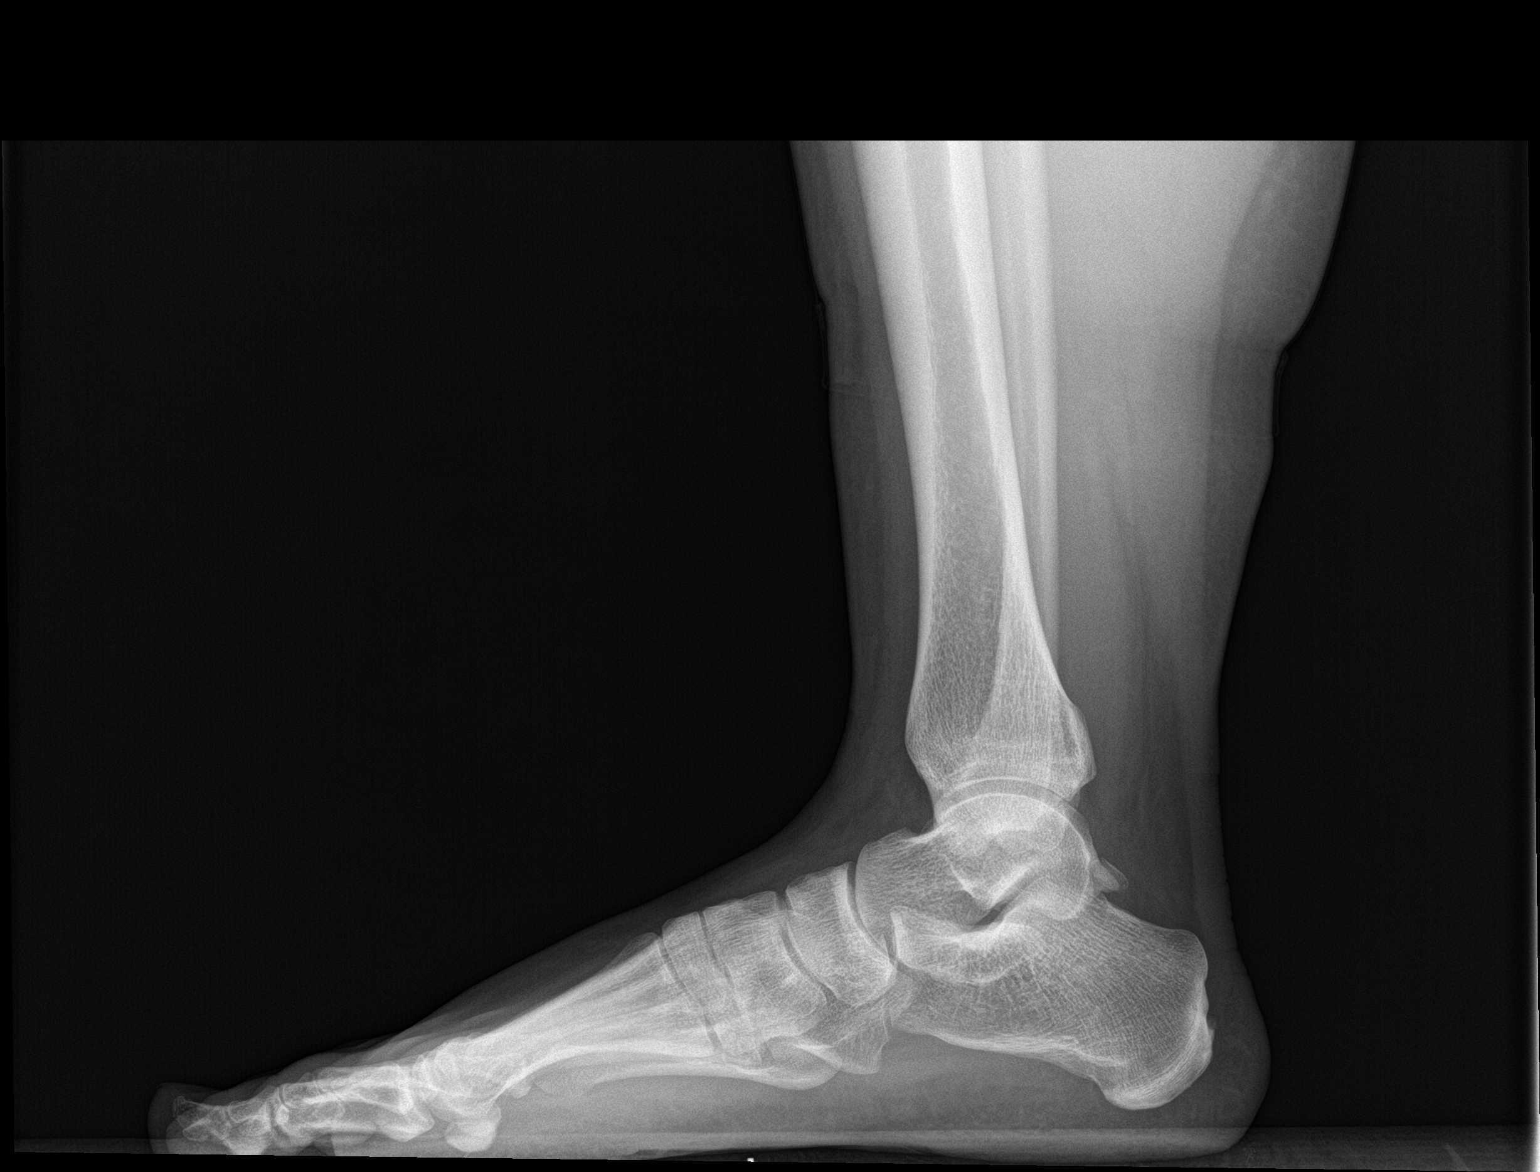

[3 of 3 positions shown; findings below may reference images not displayed]

FINDINGS: There is no evidence of fracture or dislocation. There is no
evidence of arthropathy or other focal bone abnormality. Soft
tissues are unremarkable.
IMPRESSION: Negative.

## 2023-11-02 IMAGING — DX DG CHEST 2V
2 series · 2 of 2 positions shown · non-contrast
Comparison: None.

CLINICAL DATA: Productive cough.

EXAM:
CHEST - 2 VIEW

[chest pa]
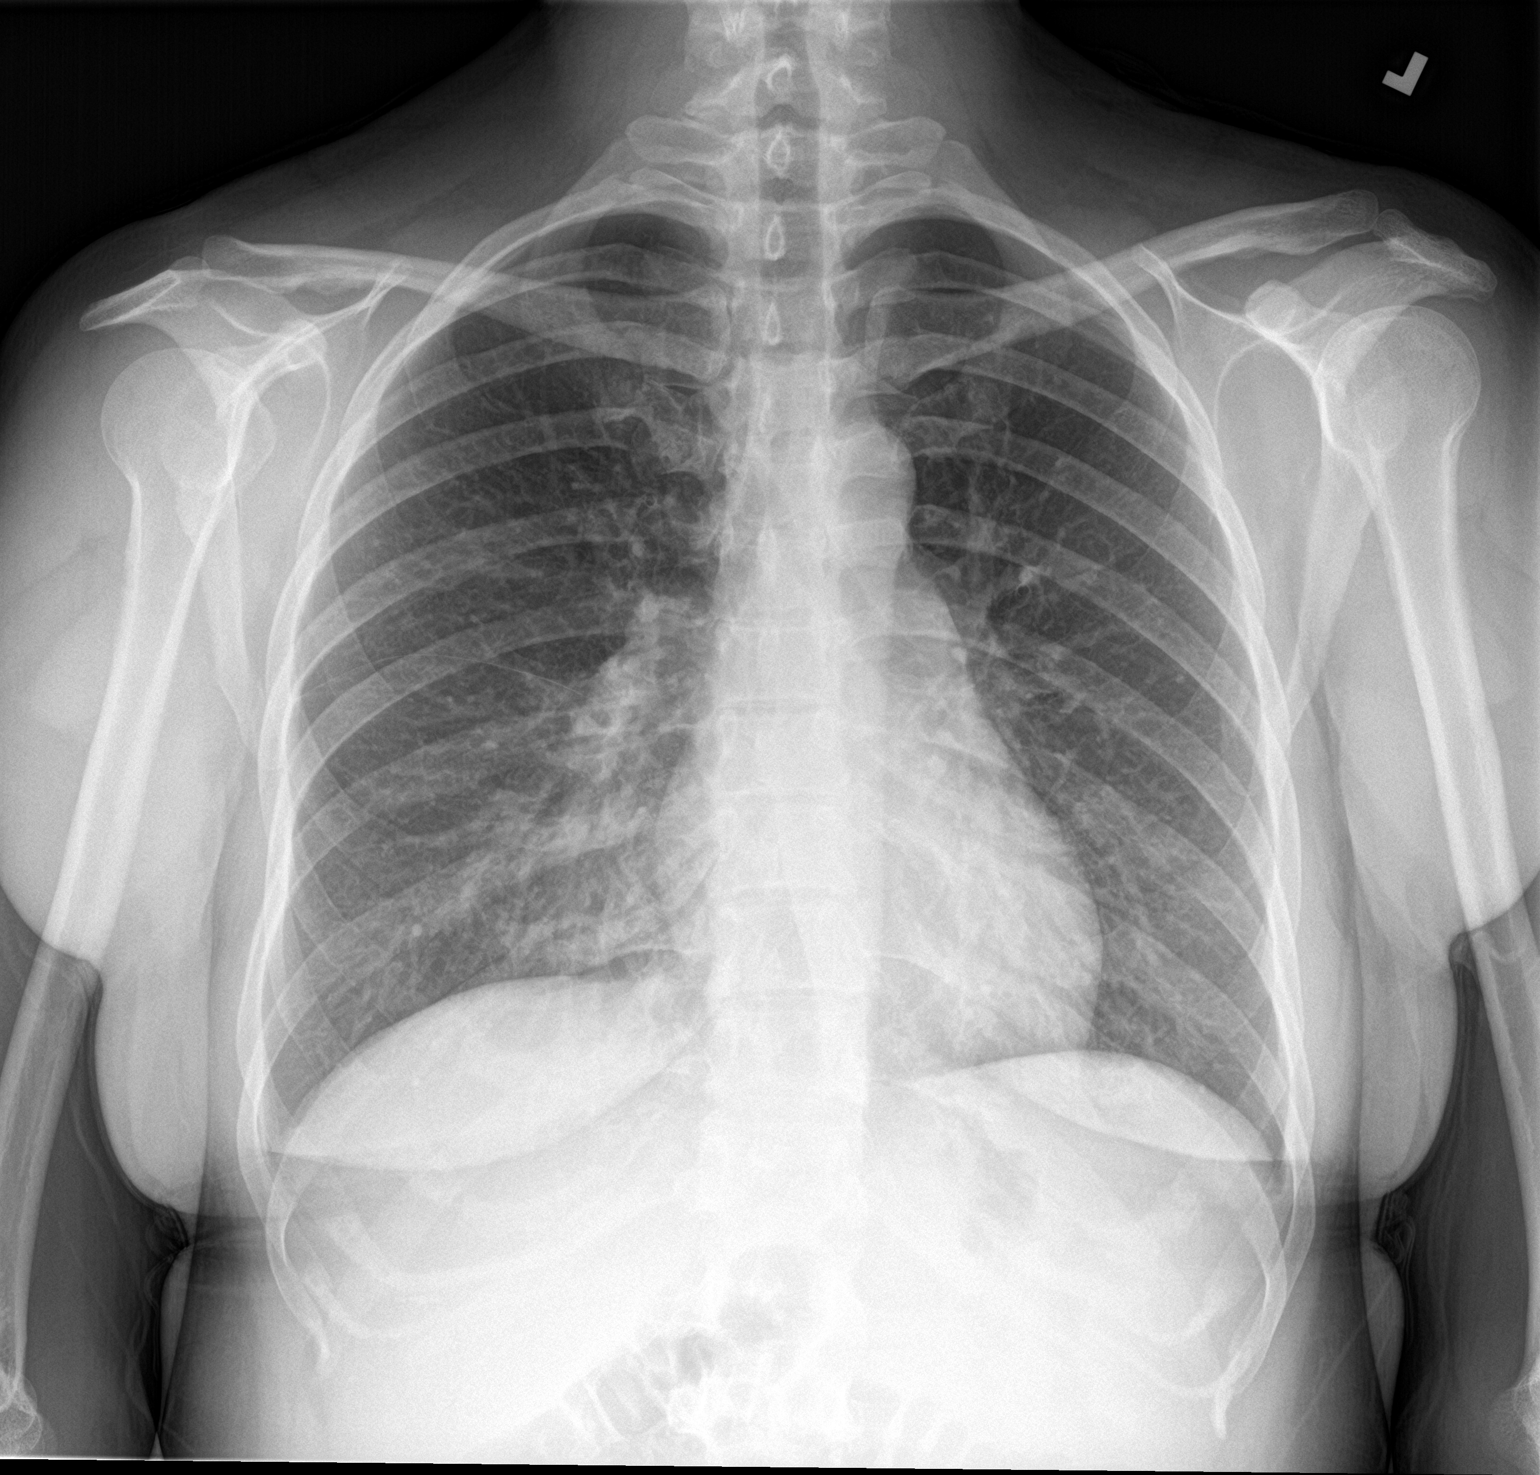

[chest lat]
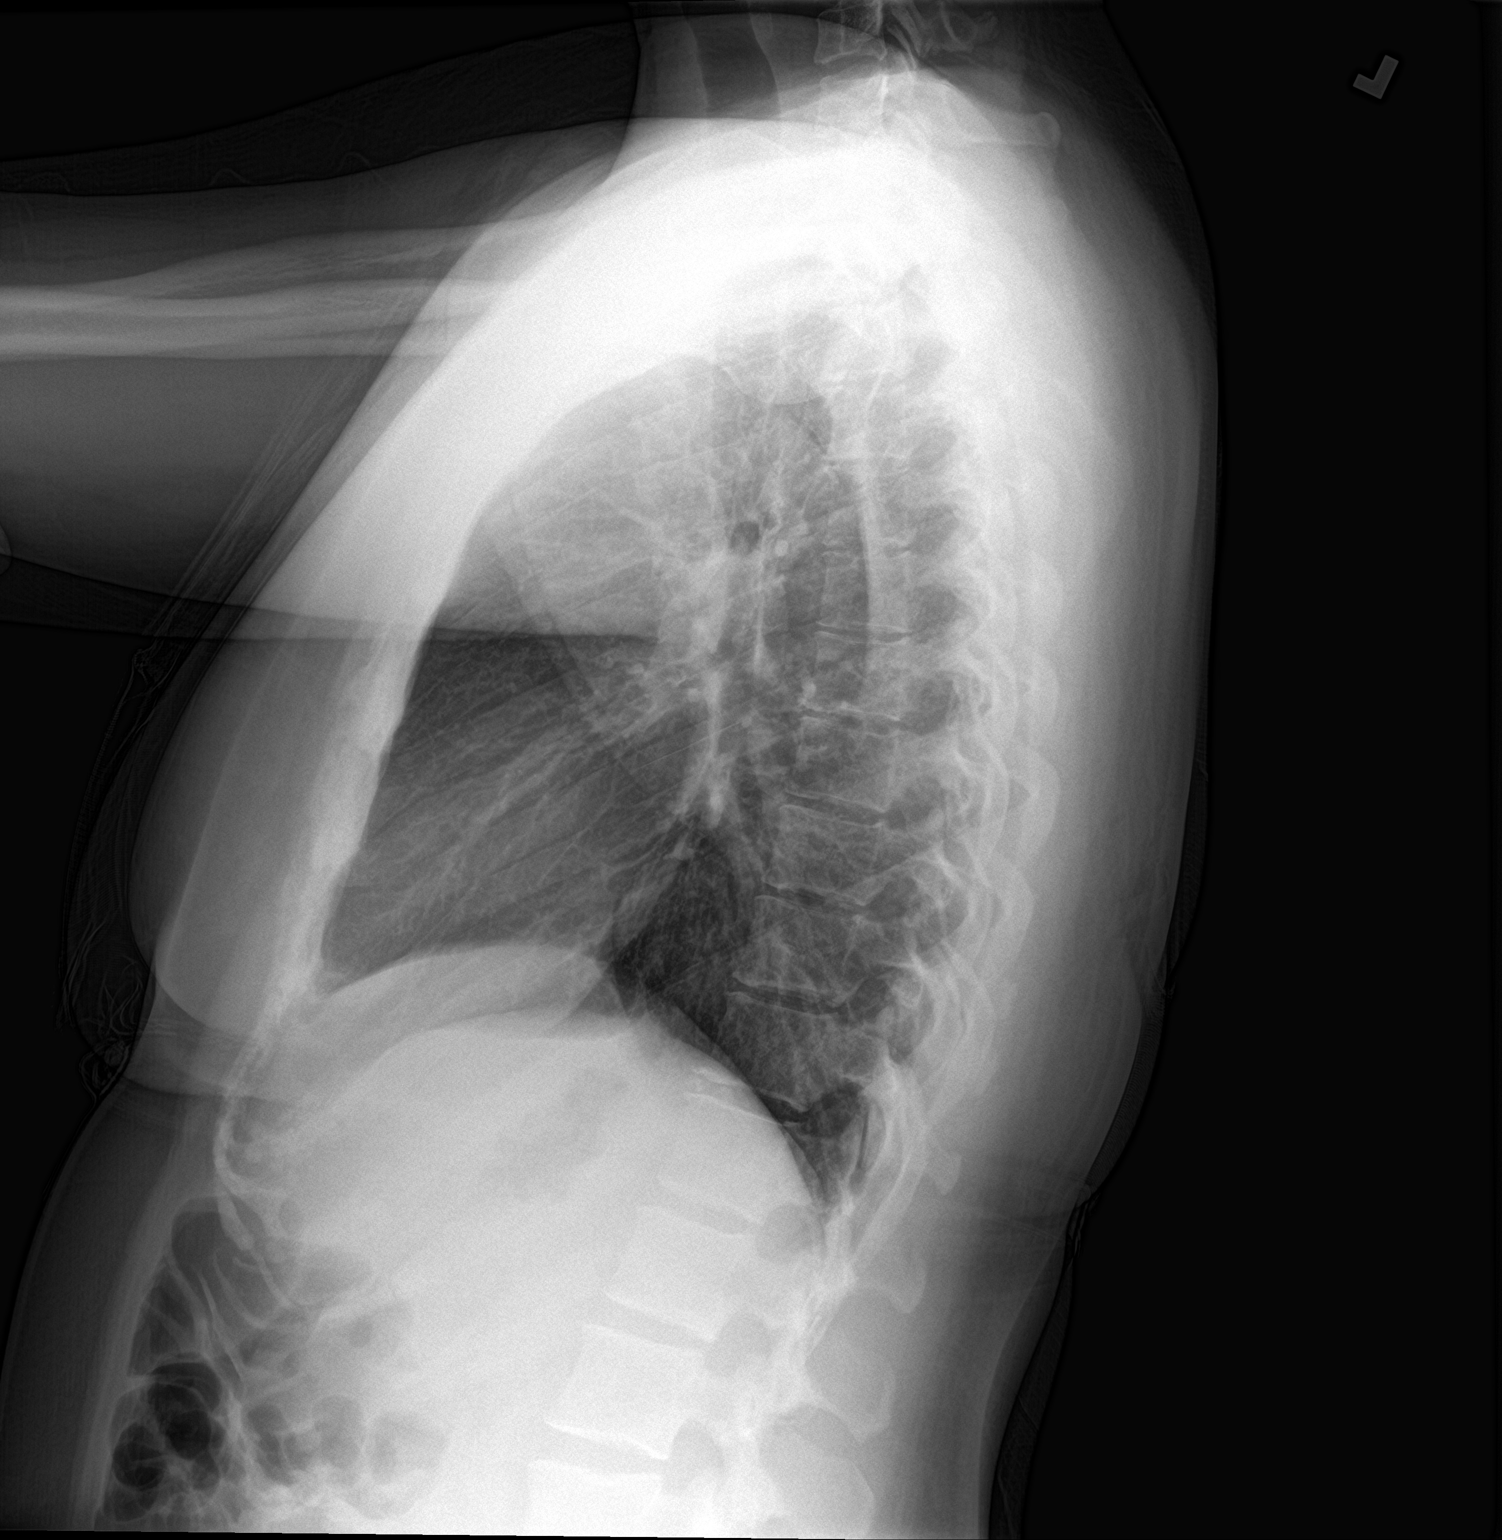

[2 of 2 positions shown; findings below may reference images not displayed]

FINDINGS: The heart size and mediastinal contours are within normal limits.
Both lungs are clear. The visualized skeletal structures are
unremarkable.
IMPRESSION: No active cardiopulmonary disease.
